# Patient Record
Sex: Female | Born: 1940
Health system: Southern US, Community
[De-identification: ages and names within clinical notes are randomized; demographics above are authoritative.]

## PROBLEM LIST (undated history)

## (undated) DIAGNOSIS — M1990 Unspecified osteoarthritis, unspecified site: Secondary | ICD-10-CM

## (undated) DIAGNOSIS — H409 Unspecified glaucoma: Secondary | ICD-10-CM

## (undated) DIAGNOSIS — E785 Hyperlipidemia, unspecified: Secondary | ICD-10-CM

## (undated) HISTORY — DX: Unspecified osteoarthritis, unspecified site: M19.90

## (undated) HISTORY — DX: Unspecified glaucoma: H40.9

## (undated) HISTORY — DX: Hyperlipidemia, unspecified: E78.5

## (undated) HISTORY — PX: BRAIN SURGERY: SHX531

---

## 1998-10-29 ENCOUNTER — Ambulatory Visit (HOSPITAL_COMMUNITY): Admission: RE | Admit: 1998-10-29 | Discharge: 1998-10-29 | Payer: Self-pay | Admitting: Family Medicine

## 1998-10-29 ENCOUNTER — Encounter: Payer: Self-pay | Admitting: Family Medicine

## 1998-11-25 ENCOUNTER — Encounter: Payer: Self-pay | Admitting: *Deleted

## 1998-11-25 ENCOUNTER — Ambulatory Visit (HOSPITAL_COMMUNITY): Admission: RE | Admit: 1998-11-25 | Discharge: 1998-11-25 | Payer: Self-pay | Admitting: *Deleted

## 1999-11-23 ENCOUNTER — Encounter: Admission: RE | Admit: 1999-11-23 | Discharge: 2000-02-21 | Payer: Self-pay | Admitting: *Deleted

## 2004-03-08 ENCOUNTER — Other Ambulatory Visit: Admission: RE | Admit: 2004-03-08 | Discharge: 2004-03-08 | Payer: Self-pay | Admitting: Family Medicine

## 2004-11-17 ENCOUNTER — Other Ambulatory Visit: Admission: RE | Admit: 2004-11-17 | Discharge: 2004-11-17 | Payer: Self-pay | Admitting: Obstetrics and Gynecology

## 2008-01-28 ENCOUNTER — Encounter: Payer: Self-pay | Admitting: Internal Medicine

## 2008-01-28 LAB — CONVERTED CEMR LAB
ALT: 17 units/L
AST: 16 units/L
Albumin: 4.1 g/dL
CO2: 20 meq/L
Calcium: 8.8 mg/dL
Creatinine, Ser: 0.67 mg/dL
Eosinophils Relative: 2 %
MCV: 91 fL
Monocytes Relative: 7 %
Neutrophils Relative %: 50 %
Platelets: 198 10*3/uL
Potassium: 4.9 meq/L
Sodium: 143 meq/L
Total Protein: 7.6 g/dL

## 2008-08-14 ENCOUNTER — Encounter: Payer: Self-pay | Admitting: Internal Medicine

## 2008-08-14 LAB — CONVERTED CEMR LAB
AST: 18 units/L
Albumin: 3.8 g/dL
CO2: 27 meq/L
Creatinine, Ser: 0.7 mg/dL
Eosinophils Relative: 1.9 %
HCT: 35.6 %
MCV: 91.4 fL
Monocytes Relative: 5.8 %
RBC: 3.9 M/uL
Sodium: 140 meq/L
Total Bilirubin: 0.5 mg/dL
WBC: 5.8 10*3/uL

## 2008-09-03 ENCOUNTER — Encounter: Payer: Self-pay | Admitting: Internal Medicine

## 2008-09-04 ENCOUNTER — Encounter: Payer: Self-pay | Admitting: Internal Medicine

## 2009-08-08 HISTORY — PX: COLONOSCOPY: SHX174

## 2010-02-15 ENCOUNTER — Ambulatory Visit: Payer: Self-pay | Admitting: Internal Medicine

## 2010-02-15 DIAGNOSIS — E669 Obesity, unspecified: Secondary | ICD-10-CM | POA: Insufficient documentation

## 2010-02-15 DIAGNOSIS — S8990XA Unspecified injury of unspecified lower leg, initial encounter: Secondary | ICD-10-CM | POA: Insufficient documentation

## 2010-02-15 DIAGNOSIS — J189 Pneumonia, unspecified organism: Secondary | ICD-10-CM

## 2010-02-15 DIAGNOSIS — S99929A Unspecified injury of unspecified foot, initial encounter: Secondary | ICD-10-CM

## 2010-02-15 DIAGNOSIS — S99919A Unspecified injury of unspecified ankle, initial encounter: Secondary | ICD-10-CM

## 2010-02-15 DIAGNOSIS — K219 Gastro-esophageal reflux disease without esophagitis: Secondary | ICD-10-CM | POA: Insufficient documentation

## 2010-02-15 DIAGNOSIS — M129 Arthropathy, unspecified: Secondary | ICD-10-CM | POA: Insufficient documentation

## 2010-02-15 DIAGNOSIS — Z87448 Personal history of other diseases of urinary system: Secondary | ICD-10-CM | POA: Insufficient documentation

## 2010-02-15 LAB — CONVERTED CEMR LAB
Triglycerides: 88 mg/dL (ref 0.0–149.0)
VLDL: 17.6 mg/dL (ref 0.0–40.0)

## 2010-02-19 ENCOUNTER — Ambulatory Visit (HOSPITAL_COMMUNITY): Admission: RE | Admit: 2010-02-19 | Discharge: 2010-02-19 | Payer: Self-pay | Admitting: Family Medicine

## 2010-02-19 ENCOUNTER — Encounter: Admission: RE | Admit: 2010-02-19 | Discharge: 2010-02-19 | Payer: Self-pay | Admitting: Internal Medicine

## 2010-02-19 ENCOUNTER — Inpatient Hospital Stay (HOSPITAL_COMMUNITY)
Admission: EM | Admit: 2010-02-19 | Discharge: 2010-02-25 | Payer: Self-pay | Source: Home / Self Care | Admitting: Emergency Medicine

## 2010-02-26 ENCOUNTER — Telehealth (INDEPENDENT_AMBULATORY_CARE_PROVIDER_SITE_OTHER): Payer: Self-pay | Admitting: *Deleted

## 2010-03-01 ENCOUNTER — Encounter: Payer: Self-pay | Admitting: Internal Medicine

## 2010-03-24 ENCOUNTER — Encounter (INDEPENDENT_AMBULATORY_CARE_PROVIDER_SITE_OTHER): Payer: Self-pay | Admitting: *Deleted

## 2010-03-29 ENCOUNTER — Ambulatory Visit: Payer: Self-pay | Admitting: Gastroenterology

## 2010-03-31 ENCOUNTER — Ambulatory Visit (HOSPITAL_COMMUNITY): Admission: RE | Admit: 2010-03-31 | Discharge: 2010-03-31 | Payer: Self-pay | Admitting: Neurosurgery

## 2010-04-13 ENCOUNTER — Encounter: Payer: Self-pay | Admitting: Internal Medicine

## 2010-04-19 ENCOUNTER — Ambulatory Visit: Payer: Self-pay | Admitting: Gastroenterology

## 2010-06-02 ENCOUNTER — Ambulatory Visit (HOSPITAL_COMMUNITY): Admission: RE | Admit: 2010-06-02 | Discharge: 2010-06-02 | Payer: Self-pay | Admitting: Neurosurgery

## 2010-08-27 IMAGING — CT CT HEAD W/O CM
1 of 2 series · 16 of 30 positions shown, 20 images · non-contrast
Comparison: [DATE]

CLINICAL DATA: Follow-up subdural

CT HEAD WITHOUT CONTRAST
TECHNIQUE: Contiguous axial images were obtained from the base of
the skull through the vertex without contrast.

[Series 3: recon 2: brain · axial · 0.47mm/px · z∈[+153,+282]mm · 16 of 56 slices shown, 20 images]
[im 3/56  brain]
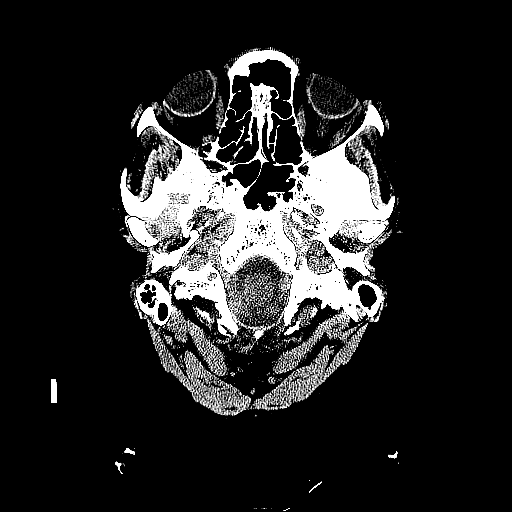
[im 3/56  bone]
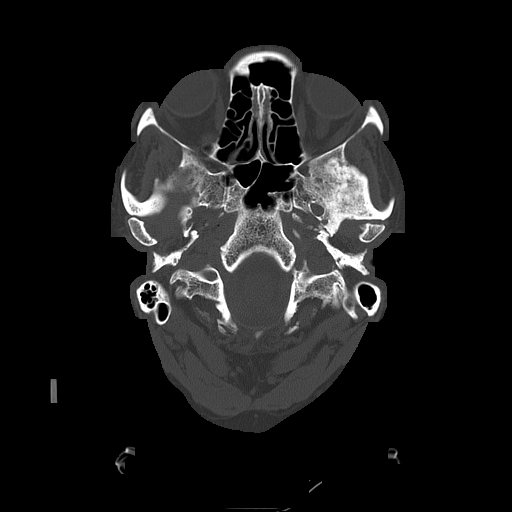
[im 6/56  brain]
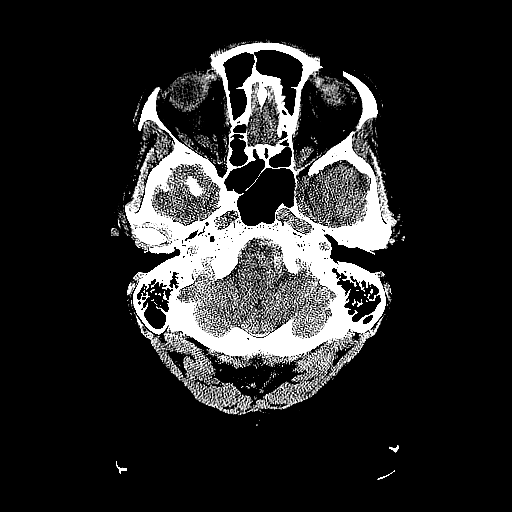
[im 9/56  brain]
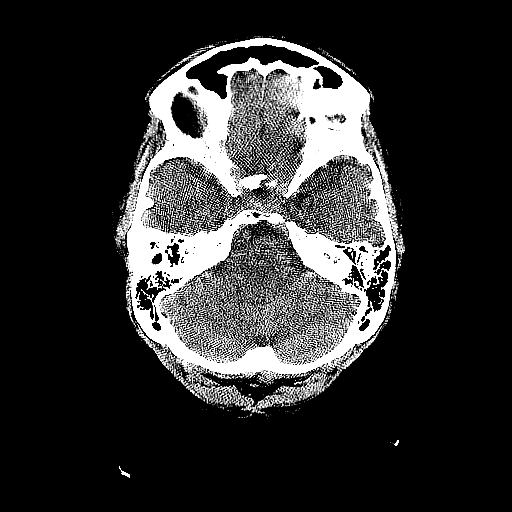
[im 12/56  brain]
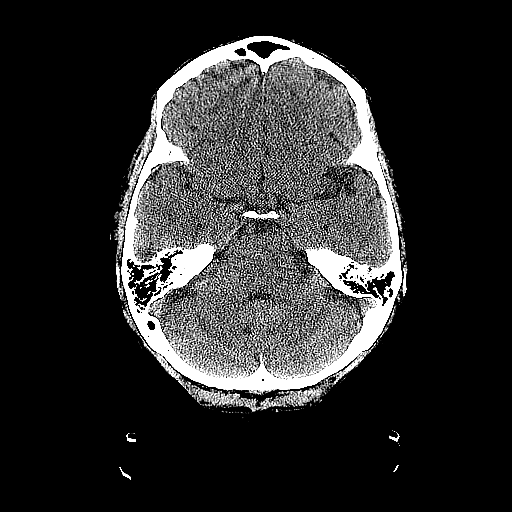
[im 18/56  brain]
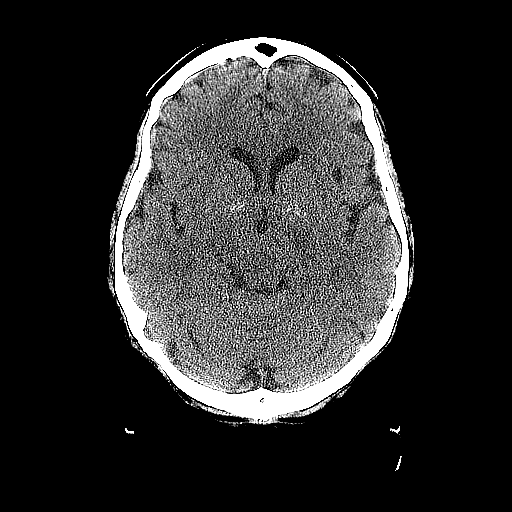
[im 18/56  bone]
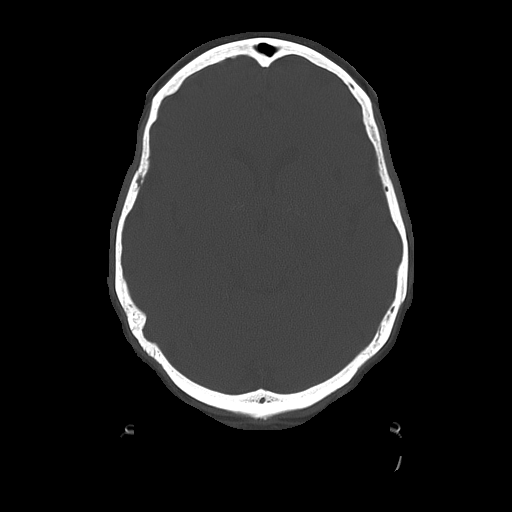
[im 21/56  brain]
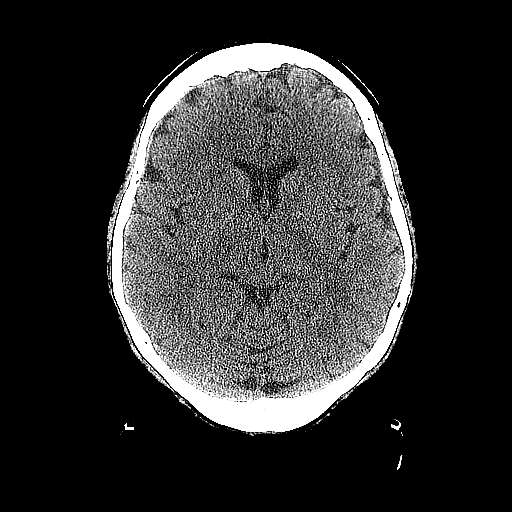
[im 24/56  brain]
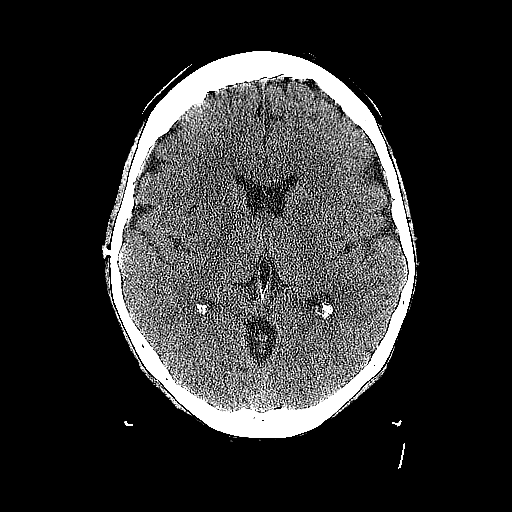
[im 27/56  brain]
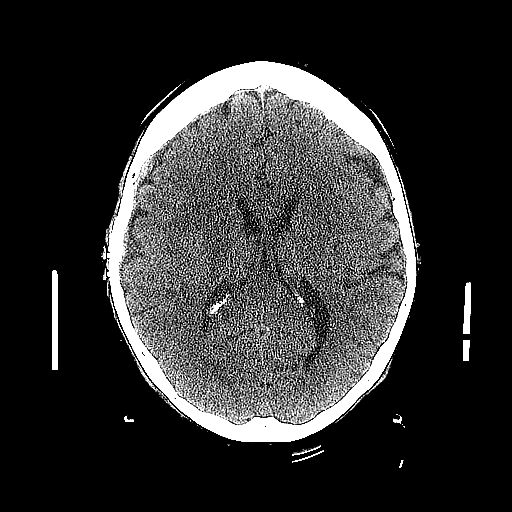
[im 29/56  brain]
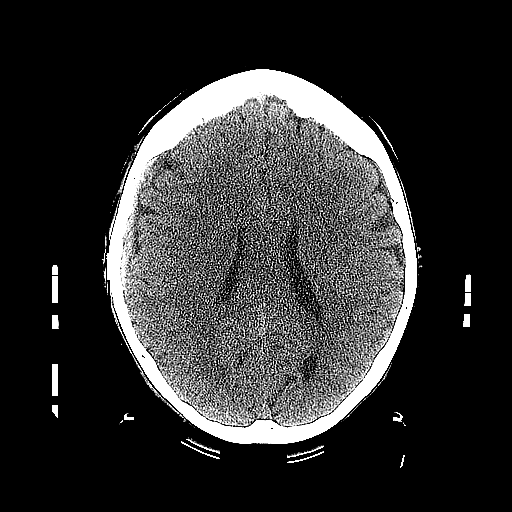
[im 29/56  bone]
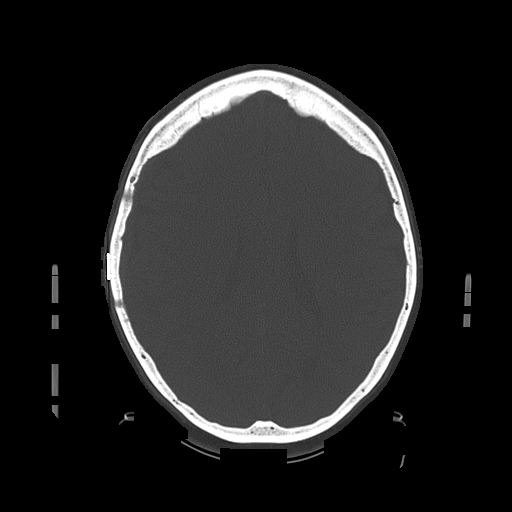
[im 32/56  brain]
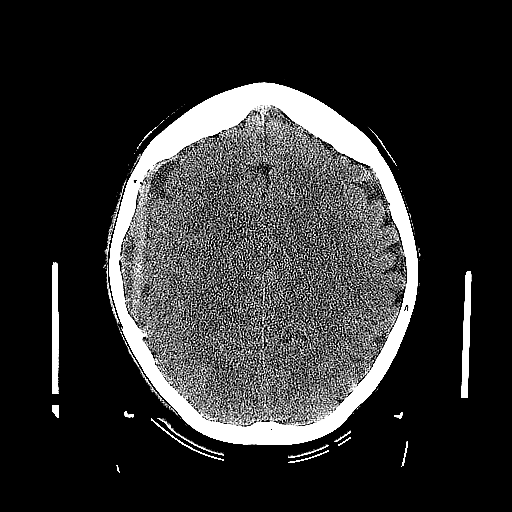
[im 35/56  brain]
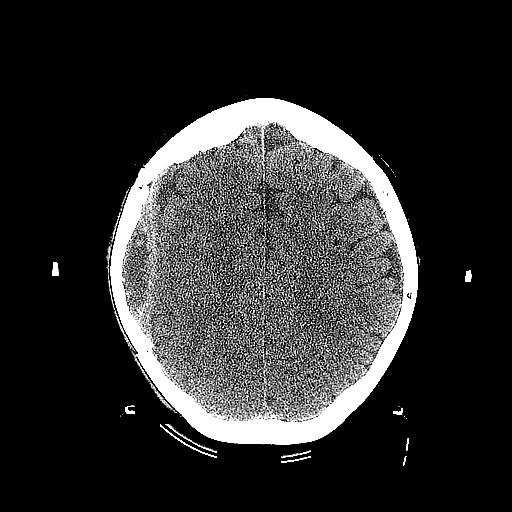
[im 38/56  brain]
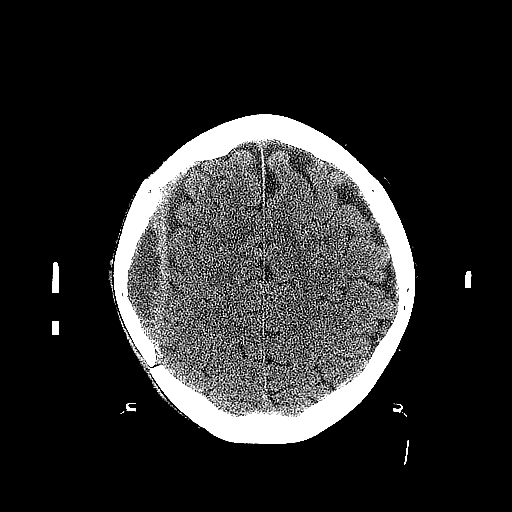
[im 44/56  brain]
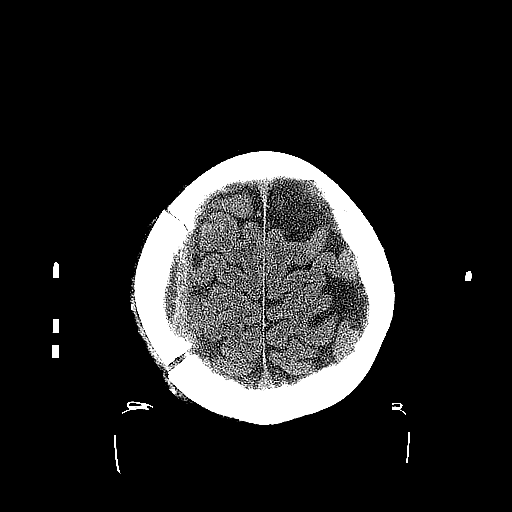
[im 44/56  bone]
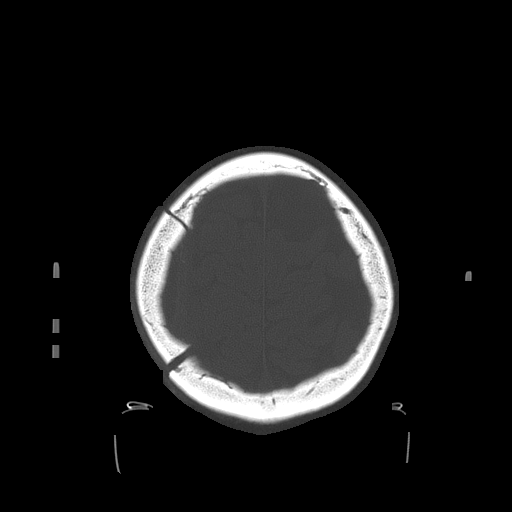
[im 47/56  brain]
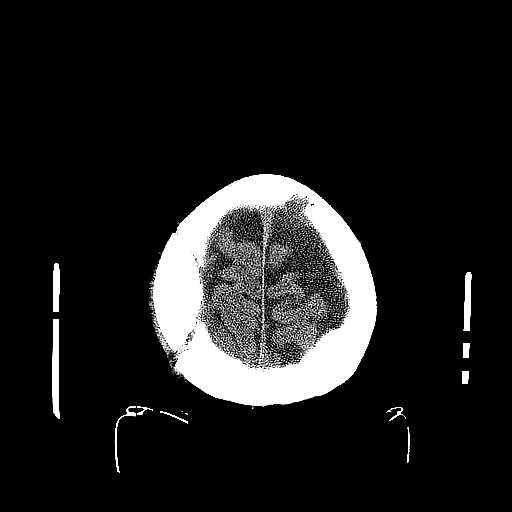
[im 50/56  brain]
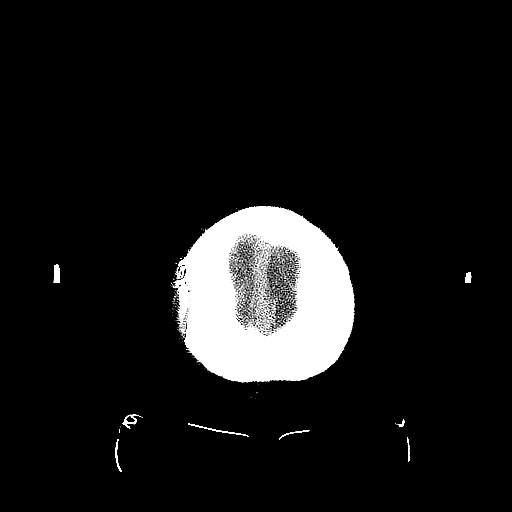
[im 53/56  brain]
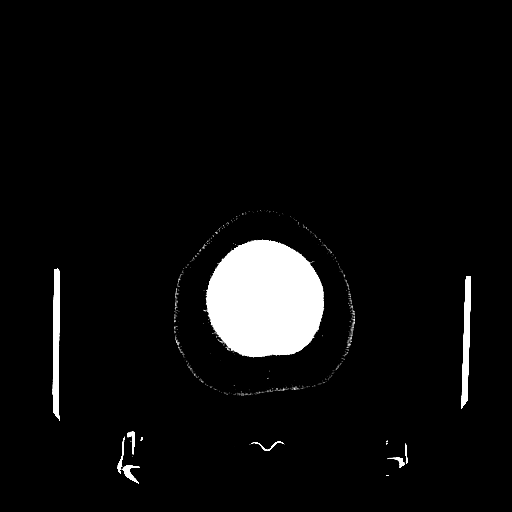

[16 of 30 positions shown; findings below may reference images not displayed]

FINDINGS: The patient has had previous right frontoparietal
craniotomy for evacuation of the large right-sided subdural
hematoma.  There is no residual or recurrent subdural collection.
There is some fluid along the inner aspect of the bone flap
measuring 15 mm in thickness.  There is no midline shift.  No sign
of ischemic infarction, mass lesion or hydrocephalus.
IMPRESSION: Previous right frontoparietal craniotomy for evacuation of a large
right sided subdural hematoma.  No residual or recurrent subdural
material.  There is fluid along the inner margin of the craniotomy
bone, measuring 15 mm in thickness.

## 2010-09-07 NOTE — Letter (Signed)
Summary: [facility]  [facility]   Imported By: Lester Oak 04/16/2010 12:05:22  _____________________________________________________________________  External Attachment:    Type:   Image     Comment:   External Document

## 2010-09-07 NOTE — Progress Notes (Signed)
  Phone Note Other Incoming   Request: Send information Summary of Call: Records received from Dr. Molly Maduro Reade's office. 17 pages forwarded to Dr. Felicity Coyer for review.

## 2010-09-07 NOTE — Miscellaneous (Signed)
Summary: [facility] Previsit/prep  Clinical Lists Changes  Medications: Added new medication of MOVIPREP 100 GM  SOLR (PEG-KCL-NACL-NASULF-NA ASC-C) As per prep instructions. - Signed Rx of MOVIPREP 100 GM  SOLR (PEG-KCL-NACL-NASULF-NA ASC-C) As per prep instructions.;  #1 x 0;  Signed;  Entered by: Wyona Almas RN;  Authorized by: Rachael Fee MD;  Method used: Electronically to [facility] [address], [place], [place], [place]  [address], Ph: [phone], Fax: [phone] Observations: Added new observation of NKA: T (03/29/2010 8:46)    Prescriptions: MOVIPREP 100 GM  SOLR (PEG-KCL-NACL-NASULF-NA ASC-C) As per prep instructions.  #1 x 0   Entered by:   Wyona Almas RN   Authorized by:   Rachael Fee MD   Signed by:   Wyona Almas RN on 03/29/2010   Method used:   Electronically to        [facility] (retail)       [address]       [place], [place]  [address]       Ph: [phone]       Fax: [phone]   RxID:   [id number]

## 2010-09-07 NOTE — Assessment & Plan Note (Signed)
Summary: new / medicare / #/ cd---PER PT  YEARLY APPT---[facility]C   Vital Signs:  Patient profile:   70 year old female Height:      66.5 inches (168.91 cm) Weight:      192.2 pounds (87.36 kg) BMI:     30.67 O2 Sat:      95 % on Room air Temp:     98.9 degrees F (37.17 degrees C) oral Pulse rate:   73 / minute BP sitting:   122 / 72  (left arm) Cuff size:   large  Vitals Entered By: Orlan Leavens (February 15, 2010 9:08 AM)  O2 Flow:  Room air CC: New patient Is Patient Diabetic? No Pain Assessment Patient in pain? no       Vision Screening:Left eye w/o correction: 20 / 20 Both eyes w/o correction:  20/ 25 Right eye with correction: 20 / 25        Vision Entered By: Orlan Leavens (February 15, 2010 9:53 AM)   Primary Care Provider:  Newt Lukes MD  CC:  New patient.  History of Present Illness: new pt to me and our practice, here to est care  also here for wellness exam; patient feels well and has no complaints.  Diet: Heart Healthy  Physical Activities: Sedentary Depression/mood screen: Negative Hearing: grossly normal today Visual Acuity: Grossly normal - see acuity ADL's: Capable  Fall Risk: None Home Safety: Good End-of-Life Planning: Advance directive - Full code/I agree   GERD - takes otc raniditine as needed - incomplete relief - occ symptoms 2x/week persist - no abd pain  feels balance and walking is getting worse - no ha or focal weakness - right ankle "doesn't workHerbalist & Management  Alcohol-Tobacco     Alcohol drinks/day: 0     Alcohol Counseling: not indicated; patient does not drink     Smoking Status: never     Tobacco Counseling: not indicated; no tobacco use  Caffeine-Diet-Exercise     Diet Counseling: to improve diet; diet is suboptimal     Nutrition Referrals: no     Does Patient Exercise: no     Exercise Counseling: to improve exercise regimen     Depression Counseling: not indicated; screening negative for  depression  Safety-Violence-Falls     Seat Belt Use: yes     Seat Belt Counseling: not indicated; patient wears seat belts     Helmet Counseling: not indicated; patient wears helmet when riding bicycle/motocycle     Firearms in the Home: no firearms in the home     Firearm Counseling: not applicable     Smoke Detectors: yes     Smoke Detector Counseling: no     Violence in the Home: no risk noted     Violence Counseling: not indicated; no violence risk noted     Fall Risk Counseling: counseling provided; falls with injury noted  Current Medications (verified): 1)  None  Allergies (verified): No Known Drug Allergies  Past History:  Past Medical History: GERD left ankle instablity, remote injury   Past Surgical History: Tubal ligation  Family History: Emotional illness (parent)  Social History: Never Smoked lives with fiance, never married, no children no alcohol use employed as Location manager Smoking Status:  never Does Patient Exercise:  no Seat Belt Use:  yes  Review of Systems       see HPI above. I have reviewed all other systems and they were negative.   Physical Exam  General:  overweight-appearing.  alert, well-developed, well-nourished, and cooperative to examination.    Head:  Normocephalic and atraumatic without obvious abnormalities. No apparent alopecia or balding. Eyes:  vision grossly intact; pupils equal, round and reactive to light.  conjunctiva and lids normal.    Ears:  normal pinnae bilaterally, without erythema, swelling, or tenderness to palpation. TMs clear, without effusion, or cerumen impaction. Hearing grossly normal bilaterally  Mouth:  teeth and gums in fair repair; mucous membranes moist, without lesions or ulcers. oropharynx clear without exudate, no erythema.  Neck:  supple, full ROM, no masses, no thyromegaly; no thyroid nodules or tenderness. no JVD or carotid bruits.   Lungs:  normal respiratory effort, no intercostal retractions  or use of accessory muscles; normal breath sounds bilaterally - no crackles and no wheezes.    Heart:  normal rate, regular rhythm, no murmur, and no rub. BLE without edema. normal DP pulses and normal cap refill in all 4 extremities    Abdomen:  soft, non-tender, normal bowel sounds, no distention; no masses and no appreciable hepatomegaly or splenomegaly.   Genitalia:  defer to gyn Msk:  left foot drop, ankle wrapped in soft brace - no effusion or gross deformity -  Neurologic:  alert & oriented X3 and cranial nerves II-XII symetrically intact.  strength normal in all extremities (except for left foot drop), sensation intact to light touch, and gait favoring left side. speech fluent without dysarthria or aphasia; follows commands with good comprehension.  Skin:  no rashes, vesicles, ulcers, or erythema. No nodules or irregularity to palpation.  Psych:  Oriented X3, memory intact for recent and remote, normally interactive, good eye contact, not anxious appearing, not depressed appearing, and not agitated.      Impression & Recommendations:  Problem # 1:  PREVENTIVE HEALTH CARE (ICD-V70.0) Patient is here for annual wellness evaluation. Patient has been counseled on age-appropriate routine health concerns for screening and prevention. These are reviewed and up-to-date. Immunizations are up-to-date or declined. Labs ordered. Risk factors for depression per USPSTF are reviewed and negative. Patient visual acuity and hearing function is screened today; ADLs are reviewed and addressed as needed; functional ability and level of safety have been reviewed and are appropriate. Education, counseling, and referrals are performed based on age appropriate health issues. Patient has information regarding separate preventitive health services covered by medicare  Orders: Gastroenterology Referral (GI) Gynecologic Referral (Gyn) Misc. Referral (Misc. Ref) TLB-Lipid Panel ([id number]) TLB-Glucose, QUANT  (82947-GLU) First annual wellness visit with prevention plan  (O1308)  Problem # 2:  ANKLE INJURY, RIGHT (ICD-959.7) eval by ortho given difficulty with ambulation - ?different orthotic if no surgical repair Orders: Orthopedic Surgeon Referral (Ortho Surgeon)  Problem # 3:  GERD (ICD-530.81) new rx rec for symptoms  Her updated medication list for this problem includes:    Omeprazole 20 Mg Cpdr (Omeprazole) .Marland Kitchen... 1 by mouth once daily x 7 days, then as needed for acid symptoms  Complete Medication List: 1)  Omeprazole 20 Mg Cpdr (Omeprazole) .Marland Kitchen.. 1 by mouth once daily x 7 days, then as needed for acid symptoms 2)  Aspirin Ec Low Strength 81 Mg Tbec (Aspirin) .Marland Kitchen.. 1 by mouth once daily 3)  Tylenol Extra Strength 500 Mg Tabs (Acetaminophen) .Marland Kitchen.. 1-2 by mouth three times a day as needed for arthiritis pain  Patient Instructions: 1)  it was good to see you today. 2)  medical physical done today - papers given to you on the included benefits - 3)  we'll make referral for mammogram, PAP/pelvic, colonoscopy. Our office will contact you regarding this appointment once made.  4)  also we'll make referral to orthopedics for your right ankle. Our office will contact you regarding this appointment once made.  5)  test(s) ordered today - your results will be posted on the phone tree for review in 48-72 hours from the time of test completion; call [phone] and enter your 9 digit MRN (listed above on this page, just below your name); if any changes need to be made or there are abnormal results, you will be contacted directly.  6)  try omperazole for your hernia and reflux symptoms - let us know if you need a prescription for this medication 7)  Please schedule a follow-up appointment in 3-6 months to review, sooner if problems.

## 2010-09-07 NOTE — Letter (Signed)
Summary: [facility] Instructions  [facility]  [address] [place], [place] [address]   Phone: [phone]  Fax: [phone]       Tara Jimenez    10-02-1940    MRN: [id number]        Procedure Day Dorna Bloom:  Duanne Limerick  69/12/11     Arrival Time:  69:00AM      Procedure Time:  10:00AM     Location of Procedure:                    _ X_  [facility] (4th Floor)                       PREPARATION FOR COLONOSCOPY WITH MOVIPREP   Starting 69 days prior to your procedure 04/14/10 do not eat nuts, seeds, popcorn, corn, beans, peas,  salads, or any raw vegetables.  Do not take any fiber supplements (e.g. Metamucil, Citrucel, and Benefiber).  THE DAY BEFORE YOUR PROCEDURE         DATE: 69/11/11  DAY: SUNDAY  1.  Drink clear liquids the entire day-NO SOLID FOOD  2.  Do not drink anything colored red or purple.  Avoid juices with pulp.  No orange juice.  3.  Drink at least 64 oz. (8 glasses) of fluid/clear liquids during the day to prevent dehydration and help the prep work efficiently.  CLEAR LIQUIDS INCLUDE: Water Jello Ice Popsicles Tea (sugar ok, no milk/cream) Powdered fruit flavored drinks Coffee (sugar ok, no milk/cream) Gatorade Juice: apple, white grape, white cranberry  Lemonade Clear bullion, consomm, broth Carbonated beverages (any kind) Strained chicken noodle soup Hard Candy                             4.  In the morning, mix first dose of MoviPrep solution:    Empty 1 Pouch A and 1 Pouch B into the disposable container    Add lukewarm drinking water to the top line of the container. Mix to dissolve    Refrigerate (mixed solution should be used within 24 hrs)  5.  Begin drinking the prep at 5:00 p.m. The MoviPrep container is divided by 4 marks.   Every 15 minutes drink the solution down to the next mark (approximately 8 oz) until the full liter is complete.   6.  Follow completed prep with 16 oz of clear liquid of your choice (Nothing  red or purple).  Continue to drink clear liquids until bedtime.  7.  Before going to bed, mix second dose of MoviPrep solution:    Empty 1 Pouch A and 1 Pouch B into the disposable container    Add lukewarm drinking water to the top line of the container. Mix to dissolve    Refrigerate  THE DAY OF YOUR PROCEDURE      DATE: 04/19/10  DAY:  MONDAY  Beginning at 5:00AM (5 hours before procedure):         1. Every 15 minutes, drink the solution down to the next mark (approx 8 oz) until the full liter is complete.  2. Follow completed prep with 16 oz. of clear liquid of your choice.    3. You may drink clear liquids until 8:00AM (2 HOURS BEFORE PROCEDURE).   MEDICATION INSTRUCTIONS  Unless otherwise instructed, you should take regular prescription medications with a small sip of water   as early as possible the  morning of your procedure.        OTHER INSTRUCTIONS  You will need a responsible adult at least 69 years of age to accompany you and drive you home.   This person must remain in the waiting room during your procedure.  Wear loose fitting clothing that is easily removed.  Leave jewelry and other valuables at home.  However, you may wish to bring a book to read or  an iPod/MP3 player to listen to music as you wait for your procedure to start.  Remove all body piercing jewelry and leave at home.  Total time from sign-in until discharge is approximately 2-3 hours.  You should go home directly after your procedure and rest.  You can resume normal activities the  day after your procedure.  The day of your procedure you should not:   Drive   Make legal decisions   Operate machinery   Drink alcohol   Return to work  You will receive specific instructions about eating, activities and medications before you leave.    The above instructions have been reviewed and explained to me by   Tara Almas RN  March 29, 2010 9:21 AM     I fully understand and can  verbalize these instructions _____________________________ Date _________

## 2010-09-07 NOTE — Progress Notes (Signed)
Summary: Physical Exam Forms / Patient Medicare  Physical Exam Forms / Patient Medicare   Imported By: Lennie Odor 02/18/2010 09:35:54  _____________________________________________________________________  External Attachment:    Type:   Image     Comment:   External Document

## 2010-09-07 NOTE — Progress Notes (Signed)
Summary: Medicare form  Medicare form   Imported By: Lester [place] 02/17/2010 07:57:46  _____________________________________________________________________  External Attachment:    Type:   Image     Comment:   External Document

## 2010-09-07 NOTE — Procedures (Signed)
Summary: Colonoscopy  Patient: Tara Jimenez Note: All result statuses are Final unless otherwise noted.  Tests: (1) Colonoscopy (COL)   COL Colonoscopy           DONE     [facility]     [address]     [place], [place]  [address]           COLONOSCOPY PROCEDURE REPORT           PATIENT:  Tara Jimenez  MR#:  [id number]     BIRTHDATE:  09/16/1940, 68 yrs. old  GENDER:  female     ENDOSCOPIST:  Rachael Fee, MD     REF. BY:  Rene Paci, M.D.     PROCEDURE DATE:  04/19/2010     PROCEDURE:  Diagnostic Colonoscopy     ASA CLASS:  Class II     INDICATIONS:  Elevated Risk Screening, sister with colon cancer     MEDICATIONS:   Fentanyl 75 mcg IV, Versed 5 mg IV           DESCRIPTION OF PROCEDURE:   After the risks benefits and     alternatives of the procedure were thoroughly explained, informed     consent was obtained.  Digital rectal exam was performed and     revealed no rectal masses.   The LB [id number] endoscope     was introduced through the anus and advanced to the cecum, which     was identified by both the appendix and ileocecal valve, without     limitations.  The quality of the prep was good, using MoviPrep.     The instrument was then slowly withdrawn as the colon was fully     examined.     <<PROCEDUREIMAGES>>     FINDINGS:  Mild diverticulosis was found in the sigmoid to     descending colon segments (see image3).  This was otherwise a     normal examination of the colon (see image1, image2, and image4).     Retroflexed views in the rectum revealed no abnormalities.    The     scope was then withdrawn from the patient and the procedure     completed.     COMPLICATIONS:  None           ENDOSCOPIC IMPRESSION:     1) Mild diverticulosis in the sigmoid to descending colon     segments     2) Otherwise normal examination; no polyps or cancers           RECOMMENDATIONS:     1) Given your significant family history of colon cancer  (sister), you should have a repeat colonoscopy in 5 years           REPEAT EXAM:  5 years           ______________________________     Rachael Fee, MD           n.     eSIGNED:   Rachael Fee at 04/19/2010 10:40 AM           Lavella Hammock, [id number]  Note: An exclamation mark (!) indicates a result that was not dispersed into the flowsheet. Document Creation Date: 04/19/2010 10:41 AM _______________________________________________________________________  (1) Order result status: Final Collection or observation date-time: 04/19/2010 10:35 Requested date-time:  Receipt date-time:  Reported date-time:  Referring Physician:   Ordering Physician: Rob Bunting [phone]) Specimen Source:  Source: Launa Grill Order Number:  [id number] Lab site:   Appended Document: Colonoscopy    Clinical Lists Changes  Observations: Added new observation of COLONNXTDUE: 04/2015 (04/19/2010 11:17)

## 2010-09-07 NOTE — Letter (Signed)
Summary: [facility]  [facility]   Imported By: Lester [place] 03/05/2010 08:13:49  _____________________________________________________________________  External Attachment:    Type:   Image     Comment:   External Document

## 2010-10-23 LAB — BASIC METABOLIC PANEL
BUN: 9 mg/dL (ref 6–23)
CO2: 24 mEq/L (ref 19–32)
Creatinine, Ser: 0.7 mg/dL (ref 0.4–1.2)
GFR calc Af Amer: >60 mL/min (ref >60)

## 2010-10-23 LAB — URINE CULTURE

## 2010-10-23 LAB — PROTIME-INR: Prothrombin Time: 12.9 seconds (ref 11.6–15.2)

## 2010-10-23 LAB — URINE MICROSCOPIC-ADD ON

## 2010-10-23 LAB — CBC
HCT: 38 % (ref 36.0–46.0)
Hemoglobin: 12.4 g/dL (ref 12.0–15.0)
MCH: 30 pg (ref 26.0–34.0)
Platelets: 160 10*3/uL (ref 150–400)
RDW: 13.3 % (ref 11.5–15.5)

## 2010-10-23 LAB — BUN: BUN: 10 mg/dL (ref 6–23)

## 2010-10-23 LAB — DIFFERENTIAL
Basophils Absolute: 0 10*3/uL (ref 0.0–0.1)
Eosinophils Absolute: 0.1 10*3/uL (ref 0.0–0.7)
Lymphocytes Relative: 26 % (ref 12–46)
Lymphs Abs: 1.5 10*3/uL (ref 0.7–4.0)
Monocytes Relative: 4 % (ref 3–12)
Neutro Abs: 3.9 10*3/uL (ref 1.7–7.7)

## 2010-10-23 LAB — APTT: aPTT: 28 seconds (ref 24–37)

## 2010-10-23 LAB — URINALYSIS, ROUTINE W REFLEX MICROSCOPIC
Bilirubin Urine: NEGATIVE
Nitrite: NEGATIVE
Protein, ur: NEGATIVE mg/dL
Specific Gravity, Urine: 1.013 (ref 1.005–1.030)

## 2010-10-23 LAB — CREATININE, SERUM
Creatinine, Ser: 0.78 mg/dL (ref 0.4–1.2)
GFR calc non Af Amer: >60 mL/min (ref >60)

## 2010-10-23 LAB — POCT CARDIAC MARKERS: Myoglobin, poc: 70.8 ng/mL (ref 12–200)

## 2010-10-23 LAB — MRSA PCR SCREENING: MRSA by PCR: NEGATIVE

## 2011-08-25 DIAGNOSIS — H8309 Labyrinthitis, unspecified ear: Secondary | ICD-10-CM | POA: Diagnosis not present

## 2011-08-25 DIAGNOSIS — R350 Frequency of micturition: Secondary | ICD-10-CM | POA: Diagnosis not present

## 2011-10-05 DIAGNOSIS — H1045 Other chronic allergic conjunctivitis: Secondary | ICD-10-CM | POA: Diagnosis not present

## 2012-01-04 DIAGNOSIS — H4011X0 Primary open-angle glaucoma, stage unspecified: Secondary | ICD-10-CM | POA: Diagnosis not present

## 2012-01-04 DIAGNOSIS — H409 Unspecified glaucoma: Secondary | ICD-10-CM | POA: Diagnosis not present

## 2012-01-05 ENCOUNTER — Ambulatory Visit (INDEPENDENT_AMBULATORY_CARE_PROVIDER_SITE_OTHER): Payer: Medicare Other | Admitting: Family Medicine

## 2012-01-05 ENCOUNTER — Ambulatory Visit: Payer: Medicare Other

## 2012-01-05 VITALS — BP 122/73 | HR 67 | Temp 98.5°F | Resp 18 | Ht 67.0 in | Wt 197.0 lb

## 2012-01-05 DIAGNOSIS — I6200 Nontraumatic subdural hemorrhage, unspecified: Secondary | ICD-10-CM

## 2012-01-05 DIAGNOSIS — R195 Other fecal abnormalities: Secondary | ICD-10-CM | POA: Diagnosis not present

## 2012-01-05 DIAGNOSIS — S065XAA Traumatic subdural hemorrhage with loss of consciousness status unknown, initial encounter: Secondary | ICD-10-CM

## 2012-01-05 DIAGNOSIS — S065X9A Traumatic subdural hemorrhage with loss of consciousness of unspecified duration, initial encounter: Secondary | ICD-10-CM

## 2012-01-05 DIAGNOSIS — D649 Anemia, unspecified: Secondary | ICD-10-CM

## 2012-01-05 LAB — IFOBT (OCCULT BLOOD): IFOBT: NEGATIVE

## 2012-01-05 LAB — POCT CBC
Granulocyte percent: 40.9 %G (ref 37–80)
Hemoglobin: 11.9 g/dL — AB (ref 12.2–16.2)
Lymph, poc: 1.7 (ref 0.6–3.4)
MCHC: 31.3 g/dL — AB (ref 31.8–35.4)
POC Granulocyte: 1.3 — AB (ref 2–6.9)
POC MID %: 8.1 %M (ref 0–12)
RDW, POC: 13.3 %

## 2012-01-05 NOTE — Progress Notes (Signed)
Patient Name: Tara Jimenez Date of Birth: Apr 09, 1941 Medical Record Number: [id number] Gender: female Date of Encounter: 01/05/2012  History of Present Illness:  Tara Jimenez is a 71 y.o. very pleasant female patient who presents with the following:  I saw her last in July 2011 when she was found to have a large subdural hematoma- she has done very well and has no neurologic deficits.   She is here today due to "trouble with bowel movements" for the last 4 or 5 weeks.  She has felt very constipated, and her stool is very dark- not black but dark.  She feels uncomfortable in her abdomen.   She is only having a stool every couple of days and has to strain to pass stool.  She has tried some mag citrate once- this did help and seemed to make her stool lighter in color as well.  No nausea or vomiting.   She had a colonoscopy a couple of years ago- it looked ok.  This was done by [facility] in September 2011- note available on Epic and looked fine except for mild diverticulosis.    Patient Active Problem List  Diagnoses  . OBESITY  . PNEUMONIA  . GERD  . ARTHRITIS  . ANKLE INJURY, RIGHT  . UTI'S, HX OF  . Subdural hematoma   No past medical history on file. No past surgical history on file. History  Substance Use Topics  . Smoking status: Never Smoker   . Smokeless tobacco: Not on file  . Alcohol Use: No   No family history on file. No Known Allergies  Medication list has been reviewed and updated.  Prior to Admission medications   Medication Sig Start Date End Date Taking? Authorizing Provider  aspirin 81 MG tablet Take 81 mg by mouth daily.   Yes Historical Provider, MD  tetrahydrozoline 0.05 % ophthalmic solution 1 drop.    Historical Provider, MD    Review of Systems: As per HPI- otherwise negative. She has not noted a change in stool caliper, but sometimes they are small "like rabbit pellets"  Physical Examination: Filed Vitals:   01/05/12 1102  BP: 122/73    Pulse: 67  Temp: 98.5 F (36.9 C)  Resp: 18   Filed Vitals:   01/05/12 1102  Height: 5\' 7"  (1.702 m)  Weight: 197 lb (89.359 kg)   Body mass index is 30.85 kg/(m^2). GEN: WDWN, NAD, Non-toxic, A & O x 3, obese HEENT: Atraumatic, Normocephalic. Neck supple. No masses, No LAD. Ears and Nose: No external deformity. CV: RRR, No M/G/R. No JVD. No thrill. No extra heart sounds. PULM: CTA B, no wheezes, crackles, rhonchi. No retractions. No resp. distress. No accessory muscle use. ABD: S, NT, +BS. No rebound. No HSM. A little full in abdomen consistent with increased stool volume EXTR: No c/c/e NEURO Normal gait.  PSYCH: Normally interactive. Conversant. Not depressed or anxious appearing.  Calm demeanor.  Rectal: small external hemorrhoid, normal exam otherwise, no gross blood  [facility] reading (PRIMARY) by  Dr. Patsy Lager.  Abdominal series:  Heavy colonic stool burden, no obstructive pattern seen Results for orders placed in visit on 01/05/12  POCT CBC      Component Value Range   WBC 3.3 (*) 4.6 - 10.2 (K/uL)   Lymph, poc 1.7  0.6 - 3.4    POC LYMPH PERCENT 51.0 (*) 10 - 50 (%L)   MID (cbc) 0.3  0 - 0.9    POC MID % 8.1  0 - 12 (%  M)   POC Granulocyte 1.3 (*) 2 - 6.9    Granulocyte percent 40.9  37 - 80 (%G)   RBC 4.19  4.04 - 5.48 (M/uL)   Hemoglobin 11.9 (*) 12.2 - 16.2 (g/dL)   HCT, POC 16.1  09.6 - 47.9 (%)   MCV 90.6  80 - 97 (fL)   MCH, POC 28.4  27 - 31.2 (pg)   MCHC 31.3 (*) 31.8 - 35.4 (g/dL)   RDW, POC 04.5     Platelet Count, POC 192  142 - 424 (K/uL)   MPV 11.2  0 - 99.8 (fL)  IFOBT (OCCULT BLOOD)      Component Value Range   IFOBT Negative       Assessment and Plan: 1. Subdural hematoma    2. Constipation  DG Abd 2 Views  3. Dark stools  POCT CBC, IFOBT POC (occult bld, rslt in office)  4. Anemia     See patient instructions plan to treat constipation with miralax and fiber.  If not better in a week or so please call or RTC. She states that she has a history  of anemia.  However, her wbc count is a little low as well.  She will follow- up with [place] or her PCP for a recheck CBC in about a month.  If her constipation symptoms do not resolve will plan to have her follow- up with GI  Kendrell Lottman, MD 01/05/2012 11:22 AM

## 2012-01-05 NOTE — Patient Instructions (Signed)
Buy some miralax- take one of two capfulls daily until effect.  You can also try a glycerin suppository.  Increase the fiber in your diet (you might try fiber one cereal) and drink plenty of water!  If you are not feeling better in the next 3 or 4 days please call

## 2012-01-18 DIAGNOSIS — H4011X0 Primary open-angle glaucoma, stage unspecified: Secondary | ICD-10-CM | POA: Diagnosis not present

## 2012-01-18 DIAGNOSIS — H409 Unspecified glaucoma: Secondary | ICD-10-CM | POA: Diagnosis not present

## 2012-03-17 ENCOUNTER — Ambulatory Visit (INDEPENDENT_AMBULATORY_CARE_PROVIDER_SITE_OTHER): Payer: Medicare Other | Admitting: Family Medicine

## 2012-03-17 VITALS — BP 139/74 | HR 75 | Temp 98.6°F | Resp 18 | Ht 67.0 in | Wt 196.0 lb

## 2012-03-17 DIAGNOSIS — S91109A Unspecified open wound of unspecified toe(s) without damage to nail, initial encounter: Secondary | ICD-10-CM | POA: Diagnosis not present

## 2012-03-17 DIAGNOSIS — S91209A Unspecified open wound of unspecified toe(s) with damage to nail, initial encounter: Secondary | ICD-10-CM

## 2012-03-17 DIAGNOSIS — B351 Tinea unguium: Secondary | ICD-10-CM | POA: Diagnosis not present

## 2012-03-17 NOTE — Progress Notes (Signed)
Patient ID: Tara Jimenez MRN: [id number], DOB: 06-27-1941, 71 y.o. Date of Encounter: 03/17/2012, 3:42 PM   PROCEDURE NOTE: Verbal consent obtained. Sterile technique employed. Numbing: Digital block with 2% lidocaine plain.  Betadine prep.  Great toenail removed without difficulty. Xeroform applied.  Wound cleansed and dressed.  Wound care instructions including precautions covered with patient.    Rhoderick Moody, PA-C 03/17/2012 3:42 PM

## 2012-03-17 NOTE — Progress Notes (Signed)
  Subjective:    Patient ID: Tayjah Lobdell, female    DOB: 03-08-1941, 71 y.o.   MRN: [id number]  HPI Keelia Graybill is a 71 y.o. female R great to struck by closet door this am. Hx of discoloration and thickened toe, but it bent back with door.   Able to weight bear, no bleeding.  Just sore at end of nail only.  Not painful in bones of toe.    Review of Systems As above.     Objective:   Physical Exam  Constitutional: She is oriented to person, place, and time. She appears well-developed and well-nourished.  Musculoskeletal:       Right foot: She exhibits normal range of motion, no bony tenderness, no swelling and normal capillary refill.       Feet:  Neurological: She is alert and oriented to person, place, and time.       nvi distally.   Skin: Skin is warm and dry.       Skin of toe intact, see above for nail.   Psychiatric: She has a normal mood and affect. Her behavior is normal.       Assessment & Plan:  Keyanah Kozicki is a 71 y.o. female 1. Toenail avulsion   2. Onychomycosis of toenail     Injury to great toe with near avulsion, and underlying onychomycotic nail. Removed per procedure note.  Advised of possible deformity of new nail, and possible onychomycosis of that nail as well.  rtc precautions given.

## 2012-05-17 DIAGNOSIS — H4011X0 Primary open-angle glaucoma, stage unspecified: Secondary | ICD-10-CM | POA: Diagnosis not present

## 2012-05-17 DIAGNOSIS — H409 Unspecified glaucoma: Secondary | ICD-10-CM | POA: Diagnosis not present

## 2012-12-11 DIAGNOSIS — H4011X0 Primary open-angle glaucoma, stage unspecified: Secondary | ICD-10-CM | POA: Diagnosis not present

## 2013-06-19 DIAGNOSIS — H409 Unspecified glaucoma: Secondary | ICD-10-CM | POA: Diagnosis not present

## 2013-06-19 DIAGNOSIS — H4011X0 Primary open-angle glaucoma, stage unspecified: Secondary | ICD-10-CM | POA: Diagnosis not present

## 2013-09-04 DIAGNOSIS — L919 Hypertrophic disorder of the skin, unspecified: Secondary | ICD-10-CM | POA: Diagnosis not present

## 2013-09-04 DIAGNOSIS — D485 Neoplasm of uncertain behavior of skin: Secondary | ICD-10-CM | POA: Diagnosis not present

## 2013-09-04 DIAGNOSIS — D1739 Benign lipomatous neoplasm of skin and subcutaneous tissue of other sites: Secondary | ICD-10-CM | POA: Diagnosis not present

## 2013-09-04 DIAGNOSIS — D239 Other benign neoplasm of skin, unspecified: Secondary | ICD-10-CM | POA: Diagnosis not present

## 2013-09-04 DIAGNOSIS — L909 Atrophic disorder of skin, unspecified: Secondary | ICD-10-CM | POA: Diagnosis not present

## 2013-09-04 DIAGNOSIS — L821 Other seborrheic keratosis: Secondary | ICD-10-CM | POA: Diagnosis not present

## 2013-09-04 DIAGNOSIS — D1801 Hemangioma of skin and subcutaneous tissue: Secondary | ICD-10-CM | POA: Diagnosis not present

## 2013-12-16 DIAGNOSIS — H4011X0 Primary open-angle glaucoma, stage unspecified: Secondary | ICD-10-CM | POA: Diagnosis not present

## 2013-12-16 DIAGNOSIS — H409 Unspecified glaucoma: Secondary | ICD-10-CM | POA: Diagnosis not present

## 2014-06-17 DIAGNOSIS — H4011X1 Primary open-angle glaucoma, mild stage: Secondary | ICD-10-CM | POA: Diagnosis not present

## 2014-09-05 ENCOUNTER — Other Ambulatory Visit: Payer: Self-pay

## 2014-09-05 DIAGNOSIS — Z1231 Encounter for screening mammogram for malignant neoplasm of breast: Secondary | ICD-10-CM

## 2014-09-10 ENCOUNTER — Ambulatory Visit (INDEPENDENT_AMBULATORY_CARE_PROVIDER_SITE_OTHER): Payer: Medicare Other

## 2014-09-10 ENCOUNTER — Ambulatory Visit (INDEPENDENT_AMBULATORY_CARE_PROVIDER_SITE_OTHER): Payer: Medicare Other | Admitting: Family Medicine

## 2014-09-10 VITALS — BP 182/98 | HR 65 | Temp 98.0°F | Resp 18 | Ht 66.0 in | Wt 190.0 lb

## 2014-09-10 DIAGNOSIS — N644 Mastodynia: Secondary | ICD-10-CM | POA: Diagnosis not present

## 2014-09-10 DIAGNOSIS — M25562 Pain in left knee: Secondary | ICD-10-CM

## 2014-09-10 DIAGNOSIS — M25519 Pain in unspecified shoulder: Secondary | ICD-10-CM

## 2014-09-10 DIAGNOSIS — M353 Polymyalgia rheumatica: Secondary | ICD-10-CM | POA: Diagnosis not present

## 2014-09-10 LAB — COMPREHENSIVE METABOLIC PANEL
ALT: 12 U/L (ref 0–35)
AST: 15 U/L (ref 0–37)
Albumin: 4.2 g/dL (ref 3.5–5.2)
Alkaline Phosphatase: 56 U/L (ref 39–117)
BUN: 12 mg/dL (ref 6–23)
CO2: 27 mEq/L (ref 19–32)
Calcium: 9.2 mg/dL (ref 8.4–10.5)
Chloride: 105 mEq/L (ref 96–112)
Creat: 0.77 mg/dL (ref 0.50–1.10)
Glucose, Bld: 90 mg/dL (ref 70–99)
Potassium: 4.5 mEq/L (ref 3.5–5.3)
Sodium: 139 mEq/L (ref 135–145)
Total Bilirubin: 0.4 mg/dL (ref 0.2–1.2)
Total Protein: 7.5 g/dL (ref 6.0–8.3)

## 2014-09-10 LAB — POCT CBC
Granulocyte percent: 50.1 %G (ref 37–80)
HCT, POC: 97.7 % — AB (ref 37.7–47.9)
Hemoglobin: 11.9 g/dL — AB (ref 12.2–16.2)
Lymph, poc: 1.7 (ref 0.6–3.4)
MCH, POC: 28.7 pg (ref 27–31.2)
MCHC: 31.5 g/dL — AB (ref 31.8–35.4)
MCV: 91.2 fL (ref 80–97)
MID (cbc): 0.2 (ref 0–0.9)
MPV: 9.7 fL (ref 0–99.8)
POC Granulocyte: 1.9 — AB (ref 2–6.9)
POC LYMPH PERCENT: 44.8 %L (ref 10–50)
POC MID %: 5.1 %M (ref 0–12)
Platelet Count, POC: 156 10*3/uL (ref 142–424)
RBC: 4.13 M/uL (ref 4.04–5.48)
RDW, POC: 14.3 %
WBC: 3.8 10*3/uL — AB (ref 4.6–10.2)

## 2014-09-10 LAB — POCT SEDIMENTATION RATE: POCT SED RATE: 53 mm/hr — AB (ref 0–22)

## 2014-09-10 MED ORDER — PREDNISONE 20 MG PO TABS: 20.0000 mg | ORAL_TABLET | Freq: Every day | ORAL | Status: DC

## 2014-09-10 MED ORDER — MELOXICAM 7.5 MG PO TABS: 7.5000 mg | ORAL_TABLET | Freq: Every day | ORAL | Status: DC

## 2014-09-10 NOTE — Addendum Note (Signed)
Addended by: Robyn Haber on: 09/10/2014 04:43 PM   Modules accepted: Orders

## 2014-09-10 NOTE — Progress Notes (Addendum)
Subjective:  This chart was scribed for Robyn Haber, MD, by Tamsen Roers, at [facility].  This patient was seen in room 1 and the patient's care was started at 12:02 PM.    Patient ID: Tara Jimenez, female    DOB: 29-Oct-1940, 74 y.o.   MRN: [id number]  HPI  HPI Comments: Tara Jimenez is a 74 y.o. female who presents to [facility] complaining of intermittent bilateral breast/ shoulder/ arm soreness / tingling onset a week ago ( notes that her symptoms are mostly on her left side).  Patient notes that she has never had these symptoms before. She also notes that her left knee has pain when she ambulates in the middle of the night. Patient will have her mammogram on Friday, she denies feeling any lumps. Patient is retired. Patients PCP is Dr. Edilia Bo who she is trying to make an appointment with.   Denies cough, SOB, abdominal pain, neck pains.  BP on right arm: 152/72      Patient Active Problem List   Diagnosis Date Noted   Subdural hematoma 02/19/2010   OBESITY 02/15/2010   PNEUMONIA 02/15/2010   GERD 02/15/2010   ARTHRITIS 02/15/2010   ANKLE INJURY, RIGHT 02/15/2010   UTI'S, HX OF 02/15/2010   History reviewed. No pertinent past medical history. History reviewed. No pertinent past surgical history. No Known Allergies Prior to Admission medications   Medication Sig Start Date End Date Taking? Authorizing Provider  tetrahydrozoline 0.05 % ophthalmic solution 1 drop.   Yes Historical Provider, MD   History   Social History   Marital Status: Single    Spouse Name: N/A    Number of Children: N/A   Years of Education: N/A   Occupational History   Not on file.   Social History Main Topics   Smoking status: Never Smoker    Smokeless tobacco: Not on file   Alcohol Use: No   Drug Use: Not on file   Sexual Activity: Not on file   Other Topics Concern   Not on file   Social History Narrative        Review of Systems  Respiratory: Negative for cough and shortness of breath.   Gastrointestinal: Negative for abdominal pain.  Musculoskeletal: Positive for myalgias. Negative for neck pain.  Neurological: Positive for numbness.       Objective:   Physical Exam  Skin:  3 mm elevated nevus on the left breast.   152/72 HEENT: Unremarkable Neck: Supple no adenopathy or thyromegaly Chest: Clear Breast exam reveals no suspicious lesions including examination of the left axilla Heart: Regular no murmur Abdomen: Soft nontender Extremity range of motion is normal, no localized pain in the shoulder  Filed Vitals:   09/10/14 1150  BP: 182/98  Pulse: 65  Temp: 98 F (36.7 C)  TempSrc: Oral  Resp: 18  Height: 5\' 6"  (1.676 m)  Weight: 190 lb (86.183 kg)  SpO2: 98%   EKG: Normal sinus rhythm [facility] reading (PRIMARY) by  Dr. Joseph Art:  Normal chest with stable small pulmonary nodules. Results for orders placed or performed in visit on 09/10/14  POCT CBC  Result Value Ref Range   WBC 3.8 (A) 4.6 - 10.2 K/uL   Lymph, poc 1.7 0.6 - 3.4   POC LYMPH PERCENT 44.8 10 - 50 %L   MID (cbc) 0.2 0 - 0.9   POC MID % 5.1 0 - 12 %M   POC Granulocyte 1.9 (A) 2 -  6.9   Granulocyte percent 50.1 37 - 80 %G   RBC 4.13 4.04 - 5.48 M/uL   Hemoglobin 11.9 (A) 12.2 - 16.2 g/dL   HCT, POC 97.7 (A) 37.7 - 47.9 %   MCV 91.2 80 - 97 fL   MCH, POC 28.7 27 - 31.2 pg   MCHC 31.5 (A) 31.8 - 35.4 g/dL   RDW, POC 14.3 %   Platelet Count, POC 156 142 - 424 K/uL   MPV 9.7 0 - 99.8 fL  ' Results for orders placed or performed in visit on 09/10/14  POCT CBC  Result Value Ref Range   WBC 3.8 (A) 4.6 - 10.2 K/uL   Lymph, poc 1.7 0.6 - 3.4   POC LYMPH PERCENT 44.8 10 - 50 %L   MID (cbc) 0.2 0 - 0.9   POC MID % 5.1 0 - 12 %M   POC Granulocyte 1.9 (A) 2 - 6.9   Granulocyte percent 50.1 37 - 80 %G   RBC 4.13 4.04 - 5.48 M/uL   Hemoglobin 11.9 (A) 12.2 - 16.2 g/dL   HCT, POC 97.7 (A) 37.7 - 47.9 %    MCV 91.2 80 - 97 fL   MCH, POC 28.7 27 - 31.2 pg   MCHC 31.5 (A) 31.8 - 35.4 g/dL   RDW, POC 14.3 %   Platelet Count, POC 156 142 - 424 K/uL   MPV 9.7 0 - 99.8 fL       Assessment & Plan:    This chart was scribed in my presence and reviewed by me personally.    ICD-9-CM ICD-10-CM   1. Shoulder pain, unspecified laterality 719.41 M25.519 POCT CBC     POCT SEDIMENTATION RATE     Comprehensive metabolic panel     TSH     EKG 12-Lead     meloxicam (MOBIC) 7.5 MG tablet  2. Breast pain 611.71 N64.4 DG Chest 2 View     EKG 12-Lead     meloxicam (MOBIC) 7.5 MG tablet  3. Knee pain, left 719.46 M25.562 meloxicam (MOBIC) 7.5 MG tablet     Signed, Robyn Haber, MD

## 2014-09-11 LAB — TSH: TSH: 1.481 u[IU]/mL (ref 0.350–4.500)

## 2014-09-12 ENCOUNTER — Ambulatory Visit
Admission: RE | Admit: 2014-09-12 | Discharge: 2014-09-12 | Disposition: A | Discharge status: Home / Self Care | Payer: Medicare Other | Source: Ambulatory Visit

## 2014-09-12 DIAGNOSIS — Z1231 Encounter for screening mammogram for malignant neoplasm of breast: Secondary | ICD-10-CM

## 2014-10-20 ENCOUNTER — Telehealth: Payer: Self-pay

## 2014-10-20 NOTE — Telephone Encounter (Signed)
Unable to confirm flu shot status

## 2014-11-10 ENCOUNTER — Ambulatory Visit (INDEPENDENT_AMBULATORY_CARE_PROVIDER_SITE_OTHER): Payer: Medicare Other | Admitting: Family Medicine

## 2014-11-10 ENCOUNTER — Encounter: Payer: Self-pay | Admitting: Family Medicine

## 2014-11-10 VITALS — BP 150/90 | HR 84 | Temp 98.6°F | Resp 16 | Ht 66.5 in | Wt 192.0 lb

## 2014-11-10 DIAGNOSIS — R8271 Bacteriuria: Secondary | ICD-10-CM

## 2014-11-10 DIAGNOSIS — R030 Elevated blood-pressure reading, without diagnosis of hypertension: Secondary | ICD-10-CM

## 2014-11-10 DIAGNOSIS — Z0000 Encounter for general adult medical examination without abnormal findings: Secondary | ICD-10-CM | POA: Diagnosis not present

## 2014-11-10 DIAGNOSIS — Z13220 Encounter for screening for lipoid disorders: Secondary | ICD-10-CM

## 2014-11-10 DIAGNOSIS — IMO0001 Reserved for inherently not codable concepts without codable children: Secondary | ICD-10-CM

## 2014-11-10 DIAGNOSIS — E669 Obesity, unspecified: Secondary | ICD-10-CM

## 2014-11-10 DIAGNOSIS — Z862 Personal history of diseases of the blood and blood-forming organs and certain disorders involving the immune mechanism: Secondary | ICD-10-CM

## 2014-11-10 DIAGNOSIS — N390 Urinary tract infection, site not specified: Secondary | ICD-10-CM

## 2014-11-10 DIAGNOSIS — Z23 Encounter for immunization: Secondary | ICD-10-CM

## 2014-11-10 LAB — POCT URINALYSIS DIPSTICK
BILIRUBIN UA: NEGATIVE
Glucose, UA: NEGATIVE
Ketones, UA: NEGATIVE
Nitrite, UA: NEGATIVE
Protein, UA: NEGATIVE
SPEC GRAV UA: 1.02
Urobilinogen, UA: 0.2
pH, UA: 5

## 2014-11-10 LAB — POCT UA - MICROSCOPIC ONLY
CASTS, UR, LPF, POC: NEGATIVE
Crystals, Ur, HPF, POC: NEGATIVE
Mucus, UA: POSITIVE
YEAST UA: POSITIVE

## 2014-11-10 LAB — CBC
HCT: 37.8 % (ref 36.0–46.0)
Hemoglobin: 12 g/dL (ref 12.0–15.0)
MCH: 29 pg (ref 26.0–34.0)
MCHC: 31.7 g/dL (ref 30.0–36.0)
MCV: 91.3 fL (ref 78.0–100.0)
MPV: 11.1 fL (ref 8.6–12.4)
PLATELETS: 215 10*3/uL (ref 150–400)
RBC: 4.14 MIL/uL (ref 3.87–5.11)
RDW: 12.9 % (ref 11.5–15.5)
WBC: 3 10*3/uL — AB (ref 4.0–10.5)

## 2014-11-10 LAB — LIPID PANEL
CHOL/HDL RATIO: 3.3 ratio
Cholesterol: 215 mg/dL — ABNORMAL HIGH (ref 0–200)
HDL: 65 mg/dL (ref >=46)
LDL Cholesterol: 131 mg/dL — ABNORMAL HIGH (ref 0–99)
TRIGLYCERIDES: 94 mg/dL (ref <150)
VLDL: 19 mg/dL (ref 0–40)

## 2014-11-10 NOTE — Progress Notes (Signed)
[facility] [address] [place] [address] [phone]  Date:  11/10/2014   Name:  Tara Jimenez   DOB:  1940-12-08   MRN:  [id number]  PCP:  Gwendolyn Grant, MD    Chief Complaint: Annual Exam and Gynecologic Exam   History of Present Illness:  Tara Jimenez is a 74 y.o. very pleasant female patient who presents with the following:  She is here today for a CPE.  She has recovered well from her subdural hematoma in 2011.  She did have surgery to evacuate the bleed.  She is fasting today for labs.  She has not been dx with HTN per her report - states that she checks her BP regularly at home and that her pressure is generally 120- 130/ 80s.  Her last pap was in 2011. She has never had an abnormal.  She had kept up with her paps prior to then. She would like to DC paps at this time is possible.   BP Readings from Last 3 Encounters:  11/10/14 177/68  09/10/14 182/98  03/17/12 139/74   She has not noted any dark stools or blood in her stools.    We had discussed this issue and her mild anemia back in 2013 but she had not followed up  She would like to get a pneumonia shot today. Last tetanus shot -she is not sure She has always been a bit anemic. This seems to run in her family She did have a colonoscopy in 2011 per [place] Patient Active Problem List   Diagnosis Date Noted  . Subdural hematoma 02/19/2010  . OBESITY 02/15/2010  . PNEUMONIA 02/15/2010  . GERD 02/15/2010  . ARTHRITIS 02/15/2010  . ANKLE INJURY, RIGHT 02/15/2010  . UTI'S, HX OF 02/15/2010    Past Medical History  Diagnosis Date  . Arthritis   . Glaucoma     Past Surgical History  Procedure Laterality Date  . Brain surgery      History  Substance Use Topics  . Smoking status: Never Smoker   . Smokeless tobacco: Not on file  . Alcohol Use: No    Family History  Problem Relation Age of Onset  . Diabetes Brother   . Heart disease Brother   . Hypertension Brother   .  Aneurysm Brother     No Known Allergies  Medication list has been reviewed and updated.  Current Outpatient Prescriptions on File Prior to Visit  Medication Sig Dispense Refill  . tetrahydrozoline 0.05 % ophthalmic solution 1 drop.     No current facility-administered medications on file prior to visit.    Review of Systems:  As per HPI- otherwise negative.   Physical Examination: Filed Vitals:   11/10/14 0936  BP: 177/68  Pulse: 84  Temp: 98.6 F (37 C)  Resp: 16   Filed Vitals:   11/10/14 0936  Height: 5' 6.5" (1.689 m)  Weight: 192 lb (87.091 kg)   Body mass index is 30.53 kg/(m^2). Ideal Body Weight: Weight in (lb) to have BMI = 25: 156.9  GEN: WDWN, NAD, Non-toxic, A & O x 3, overweight, looks well HEENT: Atraumatic, Normocephalic. Neck supple. No masses, No LAD.  Bilateral TM wnl, oropharynx normal.  PEERL,EOMI.   Ears and Nose: No external deformity. CV: RRR, No M/G/R. No JVD. No thrill. No extra heart sounds. PULM: CTA B, no wheezes, crackles, rhonchi. No retractions. No resp. distress. No accessory muscle use. ABD: S, NT, ND. No rebound. No HSM.  Benign belly EXTR: No c/c/e NEURO Normal gait.  PSYCH: Normally interactive. Conversant. Not depressed or anxious appearing.  Calm demeanor.  Breast: normal exam, no masses/ dimpling/ discharge  Results for orders placed or performed in visit on 11/10/14  CBC  Result Value Ref Range   WBC 3.0 (L) 4.0 - 10.5 K/uL   RBC 4.14 3.87 - 5.11 MIL/uL   Hemoglobin 12.0 12.0 - 15.0 g/dL   HCT 37.8 36.0 - 46.0 %   MCV 91.3 78.0 - 100.0 fL   MCH 29.0 26.0 - 34.0 pg   MCHC 31.7 30.0 - 36.0 g/dL   RDW 12.9 11.5 - 15.5 %   Platelets 215 150 - 400 K/uL   MPV 11.1 8.6 - 12.4 fL  Lipid panel  Result Value Ref Range   Cholesterol 215 (H) 0 - 200 mg/dL   Triglycerides 94 <150 mg/dL   HDL 65 >=46 mg/dL   Total CHOL/HDL Ratio 3.3 Ratio   VLDL 19 0 - 40 mg/dL   LDL Cholesterol 131 (H) 0 - 99 mg/dL  POCT urinalysis  dipstick  Result Value Ref Range   Color, UA yellow    Clarity, UA clear    Glucose, UA neg    Bilirubin, UA neg    Ketones, UA neg    Spec Grav, UA 1.020    Blood, UA trace    pH, UA 5.0    Protein, UA neg    Urobilinogen, UA 0.2    Nitrite, UA neg    Leukocytes, UA small (1+)   POCT UA - Microscopic Only  Result Value Ref Range   WBC, Ur, HPF, POC 3-4    RBC, urine, microscopic 3-6    Bacteria, U Microscopic 2+    Mucus, UA positive    Epithelial cells, urine per micros 2-3    Crystals, Ur, HPF, POC neg    Casts, Ur, LPF, POC neg    Yeast, UA positive      Assessment and Plan: Annual physical exam - Plan: POCT urinalysis dipstick  History of anemia - Plan: CBC  Screening for hyperlipidemia - Plan: Lipid panel  Elevated blood pressure - Plan: Lipid panel  Immunization due - Plan: Pneumococcal conjugate vaccine 13-valent IM, Td vaccine greater than or equal to 7yo preservative free IM  Obesity - Plan: Lipid panel  Await her labs Updated Td and prevnar today Discussed her history of anemia. Check CBC today.  If she continues to be anemic consider having her see GI for a colonoscopy sooner than 10 year interval   Signed Lamar Blinks, MD  Called her 4/6 to discuss.  Her urine appears to be possibly infected. She denies any sx of UTI and declines an abx.  However she did agree to come by and drop off a repeat urine sample in a week or so.  I will order this for her

## 2014-11-10 NOTE — Patient Instructions (Signed)
Please continue to check your BP at home at least twice a week  Please let me know if you are running over 130/85; in that case we should think about using a medication to control your pressure I will be in touch with your labs. If you continue to be anemic we should think about having you see GI sooner than 5 years  You got your tetanus and pneumonia vaccines today (Td and prevnar) You will need a second pneumonia shot next year.    Pneumococcal Conjugate Vaccine: What You Need to Know Your doctor recommends that you, or your child, get a dose of PCV13 today. 1. Why get vaccinated? Pneumococcal conjugate vaccine (called PCV13 or Prevnar 13) is recommended to protect infants and toddlers, and some older children and adults with certain health conditions, from pneumococcal disease. Pneumococcal disease is caused by infection with Streptococcus pneumoniae bacteria. These bacteria can spread from person to person through close contact. Pneumococcal disease can lead to severe health problems, including pneumonia, blood infections, and meningitis. Meningitis is an infection of the covering of the brain. Pneumococcal meningitis is fairly rare (less than 1 case per 100,000 people each year), but it leads to other health problems, including deafness and brain damage. In children, it is fatal in about 1 case out of 10. Children younger than two are at higher risk for serious disease than older children. People with certain medical conditions, people over age 73, and cigarette smokers are also at higher risk. Before vaccine, pneumococcal infections caused many problems each year in the [place] in children younger than 5, including:  more than 700 cases of meningitis,  13,000 blood infections,  about 5 million ear infections, and  about 200 deaths. About 4,000 adults still die each year because of pneumococcal infections. Pneumococcal infections can be hard to treat because some strains are  resistant to antibiotics. This makes prevention through vaccination even more important. 2. PCV13 vaccine There are more than 90 types of pneumococcal bacteria. PCV13 protects against 13 of them. These 13 strains cause most severe infections in children and about half of infections in adults.  PCV13 is routinely given to children at 2, 4, 6, and 97-55 months of age. Children in this age range are at greatest risk for serious diseases caused by pneumococcal infection. PCV13 vaccine may also be recommended for some older children or adults. Your doctor can give you details. A second type of pneumococcal vaccine, called PPSV23, may also be given to some children and adults, including anyone over age 48. There is a separate Vaccine Information Statement for this vaccine. 3. Precautions  Anyone who has ever had a life-threatening allergic reaction to a dose of this vaccine, to an earlier pneumococcal vaccine called PCV7 (or Prevnar), or to any vaccine containing diphtheria toxoid (for example, DTaP), should not get PCV13. Anyone with a severe allergy to any component of PCV13 should not get the vaccine. Tell your doctor if the person being vaccinated has any severe allergies. If the person scheduled for vaccination is sick, your doctor might decide to reschedule the shot on another day. Your doctor can give you more information about any of these precautions. 4. What are the risks of PCV13 vaccine?  With any medicine, including vaccines, there is a chance of side effects. These are usually mild and go away on their own, but serious reactions are also possible. Reported problems associated with PCV13 vary by dose and age, but generally:  About half of children  became drowsy after the shot, had a temporary loss of appetite, or had redness or tenderness where the shot was given.  About 1 out of 3 had swelling where the shot was given.  About 1 out of 3 had a mild fever, and about 1 in 20 had a higher  fever (over 102.61F).  Up to about 8 out of 10 became fussy or irritable. Adults receiving the vaccine have reported redness, pain, and swelling where the shot was given. Mild fever, fatigue, headache, chills, or muscle pain have also been reported. Life-threatening allergic reactions from any vaccine are very rare. 5. What if there is a serious reaction? What should I look for?  Look for anything that concerns you, such as signs of a severe allergic reaction, very high fever, or behavior changes. Signs of a severe allergic reaction can include hives, swelling of the face and throat, difficulty breathing, a fast heartbeat, dizziness, and weakness. These would start a few minutes to a few hours after the vaccination. What should I do?  If you think it is a severe allergic reaction or other emergency that can't wait, call 9-1-1 or get the person to the nearest hospital. Otherwise, call your doctor.  Afterward, the reaction should be reported to the Vaccine Adverse Event Reporting System (VAERS). Your doctor might file this report, or you can do it yourself through the VAERS web site at www.vaers.SamedayNews.es, or by calling 5800983550. VAERS is only for reporting reactions. They do not give medical advice. 6. The National Vaccine Injury Compensation Program The [employer] Vaccine Injury Compensation Program (VICP) is a federal program that was created to compensate people who may have been injured by certain vaccines. Persons who believe they may have been injured by a vaccine can learn about the program and about filing a claim by calling 940-712-1951 or visiting the [place] website at GoldCloset.com.ee. 7. How can I learn more?  Ask your doctor.  Call your local or state health department.  Contact the Centers for Disease Control and Prevention (CDC):  Call 364-842-0547 (1-800-CDC-INFO) or  Visit CDC's website at http://hunter.com/ CDC PCV13 Vaccine VIS (Interim)  (10/05/11) Document Released: 05/22/2006 Document Revised: 12/09/2013 Document Reviewed: 09/13/2013 [facility] Patient Information 2015 [place], [place]. This information is not intended to replace advice given to you by your health care provider. Make sure you discuss any questions you have with your health care provider. Td Vaccine (Tetanus and Diphtheria): What You Need to Know 1. Why get vaccinated? Tetanus  and diphtheria are very serious diseases. They are rare in the [place] today, but people who do become infected often have severe complications. Td vaccine is used to protect adolescents and adults from both of these diseases. Both tetanus and diphtheria are infections caused by bacteria. Diphtheria spreads from person to person through coughing or sneezing. Tetanus-causing bacteria enter the body through cuts, scratches, or wounds. TETANUS (Lockjaw) causes painful muscle tightening and stiffness, usually all over the body.  It can lead to tightening of muscles in the head and neck so you can't open your mouth, swallow, or sometimes even breathe. Tetanus kills about 1 out of every 5 people who are infected. DIPHTHERIA can cause a thick coating to form in the back of the throat.  It can lead to breathing problems, paralysis, heart failure, and death. Before vaccines, the [place] saw as many as 200,000 cases a year of diphtheria and hundreds of cases of tetanus. Since vaccination began, cases of both diseases have dropped by  about 99%. 2. Td vaccine Td vaccine can protect adolescents and adults from tetanus and diphtheria. Td is usually given as a booster dose every 10 years but it can also be given earlier after a severe and dirty wound or burn. Your doctor can give you more information. Td may safely be given at the same time as other vaccines. 3. Some people should not get this vaccine  If you ever had a life-threatening allergic reaction after a dose of any tetanus or diphtheria  containing vaccine, OR if you have a severe allergy to any part of this vaccine, you should not get Td. Tell your doctor if you have any severe allergies.  Talk to your doctor if you:  have epilepsy or another nervous system problem,  had severe pain or swelling after any vaccine containing diphtheria or tetanus,  ever had Guillain Barr Syndrome (GBS),  aren't feeling well on the day the shot is scheduled. 4. Risks of a vaccine reaction With a vaccine, like any medicine, there is a chance of side effects. These are usually mild and go away on their own. Serious side effects are also possible, but are very rare. Most people who get Td vaccine do not have any problems with it. Mild Problems  following Td (Did not interfere with activities)  Pain where the shot was given (about 8 people in 10)  Redness or swelling where the shot was given (about 1 person in 3)  Mild fever (about 1 person in 15)  Headache or Tiredness (uncommon) Moderate Problems following Td (Interfered with activities, but did not require medical attention)  Fever over 102F (rare) Severe Problems  following Td (Unable to perform usual activities; required medical attention)  Swelling, severe pain, bleeding and/or redness in the arm where the shot was given (rare). Problems that could happen after any vaccine:  Brief fainting spells can happen after any medical procedure, including vaccination. Sitting or lying down for about 15 minutes can help prevent fainting, and injuries caused by a fall. Tell your doctor if you feel dizzy, or have vision changes or ringing in the ears.  Severe shoulder pain and reduced range of motion in the arm where a shot was given can happen, very rarely, after a vaccination.  Severe allergic reactions from a vaccine are very rare, estimated at less than 1 in a million doses. If one were to occur, it would usually be within a few minutes to a few hours after the vaccination. 5. What  if there is a serious reaction? What should I look for?  Look for anything that concerns you, such as signs of a severe allergic reaction, very high fever, or behavior changes. Signs of a severe allergic reaction can include hives, swelling of the face and throat, difficulty breathing, a fast heartbeat, dizziness, and weakness. These would usually start a few minutes to a few hours after the vaccination. What should I do?  If you think it is a severe allergic reaction or other emergency that can't wait, call 9-1-1 or get the person to the nearest hospital. Otherwise, call your doctor.  Afterward, the reaction should be reported to the Vaccine Adverse Event Reporting System (VAERS). Your doctor might file this report, or you can do it yourself through the VAERS web site at www.vaers.SamedayNews.es, or by calling (708) 788-9523. VAERS is only for reporting reactions. They do not give medical advice. 6. The National Vaccine Injury Compensation Program The [employer] Vaccine Injury Compensation Program (VICP) is a [employer]  that was created to compensate people who may have been injured by certain vaccines. Persons who believe they may have been injured by a vaccine can learn about the program and about filing a claim by calling (772)209-5904 or visiting the [place] website at GoldCloset.com.ee. 7. How can I learn more?  Ask your doctor.  Contact your local or state health department.  Contact the Centers for Disease Control and Prevention (CDC):  Call 778-201-2313 (1-800-CDC-INFO)  Visit CDC's website at http://hunter.com/ CDC Td Vaccine Interim VIS (09/11/12) Document Released: 05/22/2006 Document Revised: 12/09/2013 Document Reviewed: 11/06/2013 [facility] Patient Information 2015 [place], [place]. This information is not intended to replace advice given to you by your health care provider. Make sure you discuss any questions you have with your health care provider.

## 2014-11-12 ENCOUNTER — Encounter: Payer: Self-pay | Admitting: Family Medicine

## 2014-11-21 ENCOUNTER — Telehealth: Payer: Self-pay

## 2014-11-21 MED ORDER — NITROFURANTOIN MONOHYD MACRO 100 MG PO CAPS: 100.0000 mg | ORAL_CAPSULE | Freq: Two times a day (BID) | ORAL | Status: DC

## 2014-11-21 NOTE — Telephone Encounter (Signed)
Discussed case with Dr Laney Pastor. Agree to send in pt macrobid at this time. Take twice daily for 5 days. If her symptoms continue despite this treatment she needs to return to clinic so we can assess her and likely get another urine sample with a culture.

## 2014-11-21 NOTE — Telephone Encounter (Signed)
Pt saw Darreld Mclean, MD at 11/10/2014 9:49 AM , and was offered an RX for a UTI, Patient at the time refused, but now feels that she needs this. I am going to place on high priority as she has already been seen on our office, and spoke with Dr. Lorelei Pont.

## 2014-11-21 NOTE — Telephone Encounter (Signed)
Signed Lamar Blinks, MD  Called her 4/6 to discuss. Her urine appears to be possibly infected. She denies any sx of UTI and declines an abx. However she did agree to come by and drop off a repeat urine sample in a week or so. I will order this for her

## 2014-11-22 NOTE — Telephone Encounter (Signed)
Pt.notified

## 2014-12-09 ENCOUNTER — Other Ambulatory Visit (INDEPENDENT_AMBULATORY_CARE_PROVIDER_SITE_OTHER): Payer: Medicare Other | Admitting: *Deleted

## 2014-12-09 ENCOUNTER — Other Ambulatory Visit: Payer: Self-pay | Admitting: Family Medicine

## 2014-12-09 DIAGNOSIS — N390 Urinary tract infection, site not specified: Secondary | ICD-10-CM | POA: Diagnosis not present

## 2014-12-09 DIAGNOSIS — R8299 Other abnormal findings in urine: Secondary | ICD-10-CM | POA: Diagnosis not present

## 2014-12-09 DIAGNOSIS — R8271 Bacteriuria: Secondary | ICD-10-CM

## 2014-12-09 LAB — POCT URINALYSIS DIPSTICK
Bilirubin, UA: NEGATIVE
GLUCOSE UA: NEGATIVE
Ketones, UA: NEGATIVE
NITRITE UA: NEGATIVE
Protein, UA: NEGATIVE
Spec Grav, UA: 1.025
UROBILINOGEN UA: 0.2
pH, UA: 5

## 2014-12-09 LAB — POCT UA - MICROSCOPIC ONLY
CRYSTALS, UR, HPF, POC: NEGATIVE
Casts, Ur, LPF, POC: NEGATIVE
Mucus, UA: NEGATIVE
Yeast, UA: NEGATIVE

## 2014-12-10 ENCOUNTER — Other Ambulatory Visit: Payer: Self-pay | Admitting: Family Medicine

## 2014-12-10 DIAGNOSIS — R3129 Other microscopic hematuria: Secondary | ICD-10-CM

## 2014-12-10 LAB — URINE CULTURE: Colony Count: 9000

## 2014-12-10 NOTE — Progress Notes (Addendum)
Received her repeat urine.  She continues to have microhematuria.  She feels well.  Will refer to urology- she is ok with this plan   Results for orders placed or performed in visit on 12/09/14  Urine culture  Result Value Ref Range   Colony Count 9,000 COLONIES/ML    Organism ID, Bacteria Insignificant Growth   POCT urinalysis dipstick  Result Value Ref Range   Color, UA yellow    Clarity, UA cloudy    Glucose, UA neg    Bilirubin, UA neg    Ketones, UA neg    Spec Grav, UA 1.025    Blood, UA trace    pH, UA 5.0    Protein, UA neg    Urobilinogen, UA 0.2    Nitrite, UA neg    Leukocytes, UA moderate (2+)   POCT UA - Microscopic Only  Result Value Ref Range   WBC, Ur, HPF, POC 9-15    RBC, urine, microscopic 7-9    Bacteria, U Microscopic 2+    Mucus, UA neg    Epithelial cells, urine per micros 8-15    Crystals, Ur, HPF, POC neg    Casts, Ur, LPF, POC neg    Yeast, UA neg

## 2014-12-13 ENCOUNTER — Ambulatory Visit (INDEPENDENT_AMBULATORY_CARE_PROVIDER_SITE_OTHER): Payer: Medicare Other | Admitting: Family Medicine

## 2014-12-13 ENCOUNTER — Telehealth: Payer: Self-pay | Admitting: Family Medicine

## 2014-12-13 VITALS — BP 140/66 | HR 72 | Temp 98.5°F | Resp 20 | Ht 66.25 in | Wt 192.4 lb

## 2014-12-13 DIAGNOSIS — N949 Unspecified condition associated with female genital organs and menstrual cycle: Secondary | ICD-10-CM

## 2014-12-13 DIAGNOSIS — R3129 Other microscopic hematuria: Secondary | ICD-10-CM

## 2014-12-13 LAB — POCT WET PREP WITH KOH
CLUE CELLS WET PREP PER HPF POC: NEGATIVE
KOH Prep POC: POSITIVE
RBC Wet Prep HPF POC: NEGATIVE
Trichomonas, UA: NEGATIVE
YEAST WET PREP PER HPF POC: POSITIVE

## 2014-12-13 MED ORDER — FLUCONAZOLE 150 MG PO TABS: 150.0000 mg | ORAL_TABLET | Freq: Once | ORAL | Status: DC

## 2014-12-13 NOTE — Progress Notes (Signed)
[facility] [address], [place] [address] [phone]  Date:  12/13/2014   Name:  Tara Jimenez   DOB:  08-19-1940   MRN:  [id number]  PCP:  Gwendolyn Grant, MD    Chief Complaint: Burning on outside of vagina   History of Present Illness:  Tara Jimenez is a 74 y.o. very pleasant female patient who presents with the following:  She has noted a  Burning feeling in her vulva that comes and goes.  She was not sure if this might be just hot flashes but she will have this even if her hot flashes are OW quiet.  These have been present for about 2 months.  She has not noted any discharge, no itching.    We have checked her urine twice- negative cultures with mild microhematuria so I have referred her to urology.    She is generally in good health  Patient Active Problem List   Diagnosis Date Noted  . Subdural hematoma 02/19/2010  . OBESITY 02/15/2010  . PNEUMONIA 02/15/2010  . GERD 02/15/2010  . ARTHRITIS 02/15/2010  . ANKLE INJURY, RIGHT 02/15/2010  . UTI'S, HX OF 02/15/2010    Past Medical History  Diagnosis Date  . Arthritis   . Glaucoma     Past Surgical History  Procedure Laterality Date  . Brain surgery      History  Substance Use Topics  . Smoking status: Never Smoker   . Smokeless tobacco: Never Used  . Alcohol Use: No    Family History  Problem Relation Age of Onset  . Diabetes Brother   . Heart disease Brother   . Hypertension Brother   . Aneurysm Brother     No Known Allergies  Medication list has been reviewed and updated.  No current outpatient prescriptions on file prior to visit.   No current facility-administered medications on file prior to visit.    Review of Systems:  As per HPI- otherwise negative.   Physical Examination: Filed Vitals:   12/13/14 1233  BP: 140/66  Pulse: 72  Temp: 98.5 F (36.9 C)  Resp: 20   Filed Vitals:   12/13/14 1233  Height: 5' 6.25" (1.683 m)  Weight: 192 lb 6 oz  (87.261 kg)   Body mass index is 30.81 kg/(m^2). Ideal Body Weight: Weight in (lb) to have BMI = 25: 155.7  GEN: WDWN, NAD, Non-toxic, A & O x 3, overweight, looks well HEENT: Atraumatic, Normocephalic. Neck supple. No masses, No LAD. Ears and Nose: No external deformity. CV: RRR, No M/G/R. No JVD. No thrill. No extra heart sounds. PULM: CTA B, no wheezes, crackles, rhonchi. No retractions. No resp. distress. No accessory muscle use. ABD: S, NT, ND EXTR: No c/c/e NEURO Normal gait.  PSYCH: Normally interactive. Conversant. Not depressed or anxious appearing.  Calm demeanor.  GU: thinning and slight inflammation of the vulvar tissues. Otherwise no abnormal lesion, discharge, no CMT   Results for orders placed or performed in visit on 12/13/14  POCT Wet Prep with KOH  Result Value Ref Range   Trichomonas, UA Negative    Clue Cells Wet Prep HPF POC neg    Epithelial Wet Prep HPF POC 3-6    Yeast Wet Prep HPF POC positive    Bacteria Wet Prep HPF POC 2+    RBC Wet Prep HPF POC neg    WBC Wet Prep HPF POC 3-6    KOH Prep POC Positive     Assessment  and Plan: Vaginal burning - Plan: POCT Wet Prep with KOH, fluconazole (DIFLUCAN) 150 MG tablet  Diflucan for yeast vaginitis- however if not better she will let me know, consider estrogen cream in that case  Signed Lamar Blinks, MD

## 2014-12-13 NOTE — Telephone Encounter (Signed)
Called her regarding her repeat urine. She does not have a UTI, and she does have red and white cells.  Will refer to urology for eval. She notes vaginal irritation and plans to come in today for an exam with me  Results for orders placed or performed in visit on 12/09/14  Urine culture  Result Value Ref Range   Colony Count 9,000 COLONIES/ML    Organism ID, Bacteria Insignificant Growth   POCT urinalysis dipstick  Result Value Ref Range   Color, UA yellow    Clarity, UA cloudy    Glucose, UA neg    Bilirubin, UA neg    Ketones, UA neg    Spec Grav, UA 1.025    Blood, UA trace    pH, UA 5.0    Protein, UA neg    Urobilinogen, UA 0.2    Nitrite, UA neg    Leukocytes, UA moderate (2+)   POCT UA - Microscopic Only  Result Value Ref Range   WBC, Ur, HPF, POC 9-15    RBC, urine, microscopic 7-9    Bacteria, U Microscopic 2+    Mucus, UA neg    Epithelial cells, urine per micros 8-15    Crystals, Ur, HPF, POC neg    Casts, Ur, LPF, POC neg    Yeast, UA neg

## 2014-12-13 NOTE — Patient Instructions (Signed)
It appears that you have a vaginal yeast infection Use the diflucan pill as directed- now and repeat in one week. However if your symptoms do not go away we may want to try a vaignal estrogen cream as well Let me know if your symptoms do not go away I will set up an appt for your to see a urologist regarding the small amount of blood in your urine.

## 2014-12-16 DIAGNOSIS — H4011X1 Primary open-angle glaucoma, mild stage: Secondary | ICD-10-CM | POA: Diagnosis not present

## 2015-02-03 DIAGNOSIS — R312 Other microscopic hematuria: Secondary | ICD-10-CM | POA: Diagnosis not present

## 2015-02-06 ENCOUNTER — Encounter: Payer: Self-pay | Admitting: Family Medicine

## 2015-02-06 DIAGNOSIS — R3129 Other microscopic hematuria: Secondary | ICD-10-CM | POA: Insufficient documentation

## 2015-02-20 DIAGNOSIS — K5730 Diverticulosis of large intestine without perforation or abscess without bleeding: Secondary | ICD-10-CM | POA: Diagnosis not present

## 2015-02-20 DIAGNOSIS — K449 Diaphragmatic hernia without obstruction or gangrene: Secondary | ICD-10-CM | POA: Diagnosis not present

## 2015-02-20 DIAGNOSIS — R312 Other microscopic hematuria: Secondary | ICD-10-CM | POA: Diagnosis not present

## 2015-02-20 DIAGNOSIS — N289 Disorder of kidney and ureter, unspecified: Secondary | ICD-10-CM | POA: Diagnosis not present

## 2015-02-24 DIAGNOSIS — R312 Other microscopic hematuria: Secondary | ICD-10-CM | POA: Diagnosis not present

## 2015-06-05 ENCOUNTER — Telehealth: Payer: Self-pay | Admitting: Family Medicine

## 2015-06-05 NOTE — Telephone Encounter (Signed)
SPOKE WITH PATIENT AND SHE IS COMING IN ON Wednesday November 2 AT 9AM TO CLOSE GAPS IN CARE.  SHE NEEDS BMI, FLU SHOT AND SCREENING FOR HIGH BLOOD PRESSURE AND DOCUMENTATION

## 2015-06-10 ENCOUNTER — Ambulatory Visit: Payer: Self-pay | Admitting: Family Medicine

## 2015-06-15 ENCOUNTER — Encounter: Payer: Self-pay | Admitting: Gastroenterology

## 2015-06-17 ENCOUNTER — Ambulatory Visit: Payer: Self-pay | Admitting: Family Medicine

## 2015-06-17 DIAGNOSIS — H401111 Primary open-angle glaucoma, right eye, mild stage: Secondary | ICD-10-CM | POA: Diagnosis not present

## 2015-06-17 DIAGNOSIS — H401122 Primary open-angle glaucoma, left eye, moderate stage: Secondary | ICD-10-CM | POA: Diagnosis not present

## 2015-06-24 ENCOUNTER — Encounter: Payer: Self-pay | Admitting: Family Medicine

## 2015-06-24 ENCOUNTER — Ambulatory Visit (INDEPENDENT_AMBULATORY_CARE_PROVIDER_SITE_OTHER): Payer: Medicare Other | Admitting: Family Medicine

## 2015-06-24 VITALS — BP 160/80 | HR 75 | Temp 98.4°F | Resp 18 | Ht 66.25 in | Wt 190.0 lb

## 2015-06-24 DIAGNOSIS — Z23 Encounter for immunization: Secondary | ICD-10-CM | POA: Diagnosis not present

## 2015-06-24 DIAGNOSIS — Z131 Encounter for screening for diabetes mellitus: Secondary | ICD-10-CM

## 2015-06-24 DIAGNOSIS — E663 Overweight: Secondary | ICD-10-CM | POA: Diagnosis not present

## 2015-06-24 DIAGNOSIS — D72829 Elevated white blood cell count, unspecified: Secondary | ICD-10-CM | POA: Diagnosis not present

## 2015-06-24 DIAGNOSIS — D72819 Decreased white blood cell count, unspecified: Secondary | ICD-10-CM | POA: Diagnosis not present

## 2015-06-24 DIAGNOSIS — R030 Elevated blood-pressure reading, without diagnosis of hypertension: Secondary | ICD-10-CM | POA: Diagnosis not present

## 2015-06-24 LAB — COMPREHENSIVE METABOLIC PANEL
ALBUMIN: 4 g/dL (ref 3.6–5.1)
ALT: 13 U/L (ref 6–29)
AST: 15 U/L (ref 10–35)
Alkaline Phosphatase: 53 U/L (ref 33–130)
BUN: 12 mg/dL (ref 7–25)
CHLORIDE: 107 mmol/L (ref 98–110)
CO2: 24 mmol/L (ref 20–31)
Calcium: 8.8 mg/dL (ref 8.6–10.4)
Creat: 0.79 mg/dL (ref 0.60–0.93)
Glucose, Bld: 72 mg/dL (ref 65–99)
POTASSIUM: 4 mmol/L (ref 3.5–5.3)
Sodium: 140 mmol/L (ref 135–146)
Total Bilirubin: 0.5 mg/dL (ref 0.2–1.2)
Total Protein: 7.1 g/dL (ref 6.1–8.1)

## 2015-06-24 LAB — CBC
HEMATOCRIT: 37.1 % (ref 36.0–46.0)
HEMOGLOBIN: 11.8 g/dL — AB (ref 12.0–15.0)
MCH: 29.1 pg (ref 26.0–34.0)
MCHC: 31.8 g/dL (ref 30.0–36.0)
MCV: 91.6 fL (ref 78.0–100.0)
MPV: 11.5 fL (ref 8.6–12.4)
Platelets: 192 10*3/uL (ref 150–400)
RBC: 4.05 MIL/uL (ref 3.87–5.11)
RDW: 13.4 % (ref 11.5–15.5)
WBC: 2.5 10*3/uL — AB (ref 4.0–10.5)

## 2015-06-24 NOTE — Patient Instructions (Signed)
It was good to see you today- have a great holiday season I will be in touch with your labs asap Continue to watch your blood pressure at home- the next time we see you please bring your cuff so we can make sure it is accurate  Continue exercise this winter- walking indoors can be a good option for cold days!   If you have not yet had a shingles vaccine (zostavax) I would recommend that you get this at your convenience at your drug store  Please come and see me in about 6 months

## 2015-06-24 NOTE — Progress Notes (Addendum)
[facility] [address] [place] [address] [phone]  Date:  06/24/2015   Name:  Tara Jimenez   DOB:  1940-09-05   MRN:  [id number]  PCP:  Gwendolyn Grant, MD    Chief Complaint: Follow-up   History of Present Illness:  Tara Jimenez is a 74 y.o. very pleasant female patient who presents with the following:  Here today for a follow-up appt History of obesity, subdural hematoma in 2011 Her weight is stable She is doing well overall.   She checks her BP at home- yesterday it was 132/60s.  She checks it once a week and it is always ok per her home cuff  She admits that she is not really working on her weight at home. She does not wish to see a nutrionist at this time She does exercise by working in her yard. However as the colder months come she knows she will need to do more walking She would like to do her flu shot today  She is fasting today for labs   She is menopausal- never had a bone density scan but declines this today  Wt Readings from Last 3 Encounters:  06/24/15 190 lb (86.183 kg)  12/13/14 192 lb 6 oz (87.261 kg)  11/10/14 192 lb (87.091 kg)     BP Readings from Last 3 Encounters:  06/24/15 173/68  12/13/14 140/66  11/10/14 150/90     Patient Active Problem List   Diagnosis Date Noted  . Microscopic hematuria 02/06/2015  . Subdural hematoma ([place]) 02/19/2010  . OBESITY 02/15/2010  . PNEUMONIA 02/15/2010  . GERD 02/15/2010  . ARTHRITIS 02/15/2010  . ANKLE INJURY, RIGHT 02/15/2010  . UTI'S, HX OF 02/15/2010    Past Medical History  Diagnosis Date  . Arthritis   . Glaucoma     Past Surgical History  Procedure Laterality Date  . Brain surgery      Social History  Substance Use Topics  . Smoking status: Never Smoker   . Smokeless tobacco: Never Used  . Alcohol Use: No    Family History  Problem Relation Age of Onset  . Diabetes Brother   . Heart disease Brother   . Hypertension Brother   . Aneurysm  Brother     No Known Allergies  Medication list has been reviewed and updated.  Current Outpatient Prescriptions on File Prior to Visit  Medication Sig Dispense Refill  . latanoprost (XALATAN) 0.005 % ophthalmic solution Place 1 drop into both eyes at bedtime.      No current facility-administered medications on file prior to visit.    Review of Systems:  As per HPI- otherwise negative.    Physical Examination: Filed Vitals:   06/24/15 1015  BP: 173/68  Pulse: 75  Temp: 98.4 F (36.9 C)  Resp: 18   Filed Vitals:   06/24/15 1015  Height: 5' 6.25" (1.683 m)  Weight: 190 lb (86.183 kg)   Body mass index is 30.43 kg/(m^2). Ideal Body Weight: Weight in (lb) to have BMI = 25: 155.7  GEN: WDWN, NAD, Non-toxic, A & O x 3, obese, looks well HEENT: Atraumatic, Normocephalic. Neck supple. No masses, No LAD. Ears and Nose: No external deformity. CV: RRR, No M/G/R. No JVD. No thrill. No extra heart sounds. PULM: CTA B, no wheezes, crackles, rhonchi. No retractions. No resp. distress. No accessory muscle use. EXTR: No c/c/e NEURO Normal gait.  PSYCH: Normally interactive. Conversant. Not depressed or anxious appearing.  Calm demeanor.  Assessment and Plan: Immunization due - Plan: Flu Vaccine QUAD 36+ mos IM  Elevated BP - Plan: Comprehensive metabolic panel  Leukocytosis - Plan: CBC  Screening for diabetes mellitus  Overweight  Flu shot Rechecked her BP- it is better.  Per her report her BP is always fine at home; asked her to please bring in her cuff next visit so we can check it against ours Other labs pending Noted low wbc count; recheck CBC   Signed Lamar Blinks, MD  Called to go over her labs 11/7- no answer.  Will try her back.  Want to refer to hematology due to worsening leukopenia Called again 11/18- no answer  Results for orders placed or performed in visit on 06/24/15  Comprehensive metabolic panel  Result Value Ref Range   Sodium 140 135 -  146 mmol/L   Potassium 4.0 3.5 - 5.3 mmol/L   Chloride 107 98 - 110 mmol/L   CO2 24 20 - 31 mmol/L   Glucose, Bld 72 65 - 99 mg/dL   BUN 12 7 - 25 mg/dL   Creat 0.79 0.60 - 0.93 mg/dL   Total Bilirubin 0.5 0.2 - 1.2 mg/dL   Alkaline Phosphatase 53 33 - 130 U/L   AST 15 10 - 35 U/L   ALT 13 6 - 29 U/L   Total Protein 7.1 6.1 - 8.1 g/dL   Albumin 4.0 3.6 - 5.1 g/dL   Calcium 8.8 8.6 - 10.4 mg/dL  CBC  Result Value Ref Range   WBC 2.5 (L) 4.0 - 10.5 K/uL   RBC 4.05 3.87 - 5.11 MIL/uL   Hemoglobin 11.8 (L) 12.0 - 15.0 g/dL   HCT 37.1 36.0 - 46.0 %   MCV 91.6 78.0 - 100.0 fL   MCH 29.1 26.0 - 34.0 pg   MCHC 31.8 30.0 - 36.0 g/dL   RDW 13.4 11.5 - 15.5 %   Platelets 192 150 - 400 K/uL   MPV 11.5 8.6 - 12.4 fL   11.19- called her to go over labs.  Let her know that her wbc is a bit low- I am going to go ahead and refer her to hematology at this point.  She states understanding

## 2015-06-27 ENCOUNTER — Encounter: Payer: Self-pay | Admitting: Family Medicine

## 2015-06-27 NOTE — Addendum Note (Signed)
Addended by: Lamar Blinks C on: 06/27/2015 05:32 PM   Modules accepted: Orders

## 2015-07-08 ENCOUNTER — Telehealth: Payer: Self-pay

## 2015-07-08 NOTE — Telephone Encounter (Signed)
Pt called to inq. About hemotology referral please contact her to advise.  [phone]

## 2015-07-10 ENCOUNTER — Telehealth: Payer: Self-pay | Admitting: Hematology

## 2015-07-10 NOTE — Telephone Encounter (Signed)
Pt returned call regarding new pt referral. Confirmed appt and mailed out new pt packet

## 2015-07-22 ENCOUNTER — Ambulatory Visit (HOSPITAL_BASED_OUTPATIENT_CLINIC_OR_DEPARTMENT_OTHER): Payer: Medicare Other | Admitting: Hematology

## 2015-07-22 ENCOUNTER — Telehealth: Payer: Self-pay | Admitting: Oncology

## 2015-07-22 ENCOUNTER — Encounter: Payer: Self-pay | Admitting: Hematology

## 2015-07-22 ENCOUNTER — Ambulatory Visit (HOSPITAL_BASED_OUTPATIENT_CLINIC_OR_DEPARTMENT_OTHER): Payer: Medicare Other

## 2015-07-22 VITALS — BP 150/64 | HR 81 | Temp 98.6°F | Resp 20 | Ht 66.25 in | Wt 189.1 lb

## 2015-07-22 DIAGNOSIS — M899 Disorder of bone, unspecified: Secondary | ICD-10-CM

## 2015-07-22 DIAGNOSIS — M898X9 Other specified disorders of bone, unspecified site: Secondary | ICD-10-CM

## 2015-07-22 DIAGNOSIS — R202 Paresthesia of skin: Secondary | ICD-10-CM

## 2015-07-22 DIAGNOSIS — D472 Monoclonal gammopathy: Secondary | ICD-10-CM

## 2015-07-22 DIAGNOSIS — D72819 Decreased white blood cell count, unspecified: Secondary | ICD-10-CM | POA: Diagnosis not present

## 2015-07-22 LAB — CBC & DIFF AND RETIC
BASO%: 0 % (ref 0.0–2.0)
BASOS ABS: 0 10*3/uL (ref 0.0–0.1)
EOS%: 1 % (ref 0.0–7.0)
Eosinophils Absolute: 0 10*3/uL (ref 0.0–0.5)
HCT: 37.9 % (ref 34.8–46.6)
HGB: 12 g/dL (ref 11.6–15.9)
IMMATURE RETIC FRACT: 2.8 % (ref 1.60–10.00)
LYMPH#: 1.2 10*3/uL (ref 0.9–3.3)
LYMPH%: 40.6 % (ref 14.0–49.7)
MCH: 29.1 pg (ref 25.1–34.0)
MCHC: 31.7 g/dL (ref 31.5–36.0)
MCV: 91.8 fL (ref 79.5–101.0)
MONO#: 0.2 10*3/uL (ref 0.1–0.9)
MONO%: 7.8 % (ref 0.0–14.0)
NEUT#: 1.5 10*3/uL (ref 1.5–6.5)
NEUT%: 50.6 % (ref 38.4–76.8)
Platelets: 194 10*3/uL (ref 145–400)
RBC: 4.13 10*6/uL (ref 3.70–5.45)
RDW: 13.6 % (ref 11.2–14.5)
RETIC CT ABS: 45.43 10*3/uL (ref 33.70–90.70)
Retic %: 1.1 % (ref 0.70–2.10)
WBC: 2.9 10*3/uL — ABNORMAL LOW (ref 3.9–10.3)

## 2015-07-22 LAB — COMPREHENSIVE METABOLIC PANEL
ALT: 16 U/L (ref 0–55)
AST: 18 U/L (ref 5–34)
Albumin: 3.8 g/dL (ref 3.5–5.0)
Alkaline Phosphatase: 54 U/L (ref 40–150)
Anion Gap: 8 mEq/L (ref 3–11)
BUN: 12.2 mg/dL (ref 7.0–26.0)
CHLORIDE: 111 meq/L — AB (ref 98–109)
CO2: 23 meq/L (ref 22–29)
CREATININE: 0.9 mg/dL (ref 0.6–1.1)
Calcium: 9.2 mg/dL (ref 8.4–10.4)
EGFR: 78 mL/min/{1.73_m2} — ABNORMAL LOW (ref >90)
GLUCOSE: 86 mg/dL (ref 70–140)
Potassium: 4.2 mEq/L (ref 3.5–5.1)
Sodium: 142 mEq/L (ref 136–145)
Total Bilirubin: 0.51 mg/dL (ref 0.20–1.20)
Total Protein: 7.9 g/dL (ref 6.4–8.3)

## 2015-07-22 LAB — CHCC SMEAR

## 2015-07-22 LAB — TECHNOLOGIST REVIEW

## 2015-07-22 MED ORDER — MULTI-VITAMIN/MINERALS PO TABS: 1.0000 | ORAL_TABLET | Freq: Every day | ORAL | Status: DC

## 2015-07-22 MED ORDER — CALCIUM CITRATE-VITAMIN D 250-100 MG-UNIT PO TABS: 1.0000 | ORAL_TABLET | Freq: Two times a day (BID) | ORAL | Status: DC

## 2015-07-22 MED ORDER — B COMPLEX VITAMINS PO CAPS: 1.0000 | ORAL_CAPSULE | Freq: Every day | ORAL | Status: DC

## 2015-07-22 NOTE — Telephone Encounter (Signed)
Gave adn prnted appt sched and avs for pt for Feb 2017 °

## 2015-07-22 NOTE — Progress Notes (Signed)
[facility]    HEMATOLOGY/ONCOLOGY CONSULTATION NOTE  Date of Service: 07/22/2015  Patient Care Team: Rowe Clack, MD as PCP - General  CHIEF COMPLAINTS/PURPOSE OF CONSULTATION:  Leucopenia  HISTORY OF PRESENTING ILLNESS:  Tara Jimenez is a wonderful 74 y.o. female who has been referred to [place] by Dr .Gwendolyn Grant, MD for evaluation and management of leukopenia.   patient has a history of hypertension, GERD, arthritis , UTIs ,  Previous history of a subdural hematoma in 2011 who has been referred to [place] for leukopenia.  Patient has had mild leukopenia In the mid 3000  Range since at least 2009.  Recently was noted to have  WBC count of 2.5k (diff on available) On 06/24/2015 which was lower than previous baseline and therefore triggered a hematology consultation.   Patient notes that other than a few episodes of urinary tract infection  she has not had an increased tendency for frequent infections.    She notes she has had some complains of a squeezing feeling in her hands and legs for a few weeks. Also notes some joint pains which she attributes to her arthritis.  Denies starting any new medications recently. No fevers or chills. No unexplained weight loss. No night sweats.  No obvious recent viral infections.   Her labs today show that her WBC counts have improved to 2.9k  Within Trainer of 1500. Patient has a normal hemoglobin of 12 and a normal platelet count of 194k.   No other specific focal symptoms.  MEDICAL HISTORY:  Past Medical History  Diagnosis Date  . Arthritis   . Glaucoma   . Patient Active Problem List   Diagnosis Date Noted  . Leukopenia 07/22/2015  . Paresthesias 07/22/2015  . Bone pain 07/22/2015  . Microscopic hematuria 02/06/2015  . Subdural hematoma ([place]) 02/19/2010  . OBESITY 02/15/2010  . PNEUMONIA 02/15/2010  . GERD 02/15/2010  . ARTHRITIS 02/15/2010  . ANKLE INJURY, RIGHT 02/15/2010  . UTI'S, HX OF 02/15/2010    SURGICAL HISTORY: Past Surgical  History  Procedure Laterality Date  . Brain surgery      SOCIAL HISTORY: Social History   Social History  . Marital Status: Single    Spouse Name: N/A  . Number of Children: N/A  . Years of Education: N/A   Occupational History  . Not on file.   Social History Main Topics  . Smoking status: Never Smoker   . Smokeless tobacco: Never Used  . Alcohol Use: No  . Drug Use: No  . Sexual Activity: Not on file   Other Topics Concern  . Not on file   Social History Narrative   No issues with ETOH use.  FAMILY HISTORY: Family History  Problem Relation Age of Onset  . Diabetes Brother   . Heart disease Brother   . Hypertension Brother   . Aneurysm Brother     ALLERGIES:  has No Known Allergies.  MEDICATIONS:  Current Outpatient Prescriptions  Medication Sig Dispense Refill  . aspirin EC 81 MG tablet Take 81 mg by mouth daily.    [facility] Kitchen latanoprost (XALATAN) 0.005 % ophthalmic solution Place 1 drop into both eyes at bedtime.      No current facility-administered medications for this visit.  Advil as needed for arthritis  REVIEW OF SYSTEMS:    10 Point review of Systems was done is negative except as noted above.  PHYSICAL EXAMINATION: ECOG PERFORMANCE STATUS: 1 - Symptomatic but completely ambulatory  . Filed Vitals:   07/22/15  0951  BP: 150/64  Pulse: 81  Temp: 98.6 F (37 C)  Resp: 20   Filed Weights   07/22/15 0951  Weight: 189 lb 1.6 oz (85.775 kg)   .Body mass index is 30.28 kg/(m^2).  GENERAL:alert, in no acute distress and comfortable SKIN: skin color, texture, turgor are normal, no rashes or significant lesions EYES: normal, conjunctiva are pink and non-injected, sclera clear OROPHARYNX:no exudate, no erythema and lips, buccal mucosa, and tongue normal  NECK: supple, no JVD, thyroid normal size, non-tender, without nodularity LYMPH:  no palpable lymphadenopathy in the cervical, axillary or inguinal LUNGS: clear to auscultation with normal  respiratory effort HEART: regular rate & rhythm,  no murmurs and no lower extremity edema ABDOMEN: abdomen soft, non-tender, normoactive bowel sounds  Musculoskeletal: no cyanosis of digits and no clubbing  PSYCH: alert & oriented x 3 with fluent speech NEURO: no focal motor/sensory deficits  LABORATORY DATA:  I have reviewed the data as listed  . CBC Latest Ref Rng 07/22/2015 06/24/2015 11/10/2014  WBC 3.9 - 10.3 10e3/uL 2.9(L) 2.5(L) 3.0(L)  Hemoglobin 11.6 - 15.9 g/dL 12.0 11.8(L) 12.0  Hematocrit 34.8 - 46.6 % 37.9 37.1 37.8  Platelets 145 - 400 10e3/uL 194 192 215   ANC 1.5 . CMP Latest Ref Rng 07/22/2015 06/24/2015 09/10/2014  Glucose 70 - 140 mg/dl 86 72 90  BUN 7.0 - 26.0 mg/dL 12.2 12 12   Creatinine 0.6 - 1.1 mg/dL 0.9 0.79 0.77  Sodium 136 - 145 mEq/L 142 140 139  Potassium 3.5 - 5.1 mEq/L 4.2 4.0 4.5  Chloride 98 - 110 mmol/L - 107 105  CO2 22 - 29 mEq/L 23 24 27   Calcium 8.4 - 10.4 mg/dL 9.2 8.8 9.2  Total Protein 6.4 - 8.3 g/dL 7.9 7.1 7.5  Total Bilirubin 0.20 - 1.20 mg/dL 0.51 0.5 0.4  Alkaline Phos 40 - 150 U/L 54 53 56  AST 5 - 34 U/L 18 15 15   ALT 0 - 55 U/L 16 13 12      . Lab Results  Component Value Date   TOTALPROTELP 7.0 07/22/2015   ALBUMINELP 3.7* 07/22/2015   A1GS 0.3 07/22/2015   A2GS 1.0* 07/22/2015   BETS 0.4 07/22/2015   BETA2SER 0.4 07/22/2015   GAMS 1.2 07/22/2015   SPEI * 07/22/2015  Immunofix Electr Int  *   Comments: Monoclonal IgA kappa protein is present.         SPE Interp.  *   Comments: A restricted band consistent with monoclonal protein is present.  The monoclonal protein peak accounts for 0.3 g/dL of the total  1.0 g/dL of protein in the ALPHA-2 region.  Reviewed by Odis Hollingshead, MD, PhD, FCAP        Peripheral Blood Smear (reviewed by me) -normocytic red cells, no increased schistocytes, adequate platelets, wbc with normal appearing maturation, some pelgeroid neutrophils. Increased LGL's.  RADIOGRAPHIC  STUDIES: I have personally reviewed the radiological images as listed and agreed with the findings in the report. No results found.  ASSESSMENT & PLAN:   74 yo AAF with   1) Chronic leucopenia with some recent worsening. Now seems to be improving again with WBC 2.9k up from 2.5k with normal ANC of 1500 with no clear association with increased infections. Likely some baseline leucopenia (?Benign ethnic neutropenia - patient has AA ethnicity) Peripheral blood smear shows increased large granular lymphocytes that could be reactive to a viral infection or might suggestive mild LGL leukemia (that at this time would  not need treatment)  2) Bone pains and paresthesias ? Neuropathy. 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 -will get flow cytometry and TCR gene rearrangement studies due to concern for possible LGL leukemia -rpt SPEP, K/L SFLC,  -skeletal survey XRay -no indication for G-CSF at this time. -continue f/u with PCP for management of other medical issues.   All of the patients questions were answered with apparent satisfaction. The patient knows to call the clinic with any problems, questions or concerns.  I spent 45 minutes counseling the patient face to face. The total time spent in the appointment was 60 minutes and more than 50% was on counseling and direct patient cares.    Sullivan Lone MD [place] AAHIVMS [facility] Hematology/Oncology Physician [facility]  (Office):       [phone] (Work cell):  [phone] (Fax):           [phone]  07/22/2015 10:12 AM

## 2015-07-24 LAB — SPEP & IFE WITH QIG
ALPHA-1-GLOBULIN: 0.3 g/dL (ref 0.2–0.3)
Abnormal Protein Band1: 0.3 g/dL
Albumin ELP: 3.7 g/dL — ABNORMAL LOW (ref 3.8–4.8)
Alpha-2-Globulin: 1 g/dL — ABNORMAL HIGH (ref 0.5–0.9)
Beta 2: 0.4 g/dL (ref 0.2–0.5)
Beta Globulin: 0.4 g/dL (ref 0.4–0.6)
Gamma Globulin: 1.2 g/dL (ref 0.8–1.7)
IGM, SERUM: 27 mg/dL — AB (ref 52–322)
IgA: 556 mg/dL — ABNORMAL HIGH (ref 69–380)
IgG (Immunoglobin G), Serum: 1360 mg/dL (ref 690–1700)
Total Protein, Serum Electrophoresis: 7 g/dL (ref 6.1–8.1)

## 2015-07-24 LAB — VITAMIN D 25 HYDROXY (VIT D DEFICIENCY, FRACTURES): Vit D, 25-Hydroxy: 14 ng/mL — ABNORMAL LOW (ref 30–100)

## 2015-07-24 LAB — VITAMIN B12: Vitamin B-12: 452 pg/mL (ref 211–911)

## 2015-07-24 LAB — COPPER, SERUM: COPPER: 119 ug/dL (ref 70–175)

## 2015-08-17 DIAGNOSIS — D472 Monoclonal gammopathy: Secondary | ICD-10-CM | POA: Insufficient documentation

## 2015-09-15 ENCOUNTER — Ambulatory Visit (HOSPITAL_COMMUNITY)
Admission: RE | Admit: 2015-09-15 | Discharge: 2015-09-15 | Disposition: A | Discharge status: Home / Self Care | Payer: Medicare Other | Source: Ambulatory Visit | Attending: Hematology | Admitting: Hematology

## 2015-09-15 DIAGNOSIS — D472 Monoclonal gammopathy: Secondary | ICD-10-CM | POA: Diagnosis present

## 2015-09-16 ENCOUNTER — Encounter: Payer: Self-pay | Admitting: Hematology

## 2015-09-16 ENCOUNTER — Other Ambulatory Visit (HOSPITAL_COMMUNITY)
Admission: RE | Admit: 2015-09-16 | Discharge: 2015-09-16 | Disposition: A | Discharge status: Home / Self Care | Payer: Medicare Other | Source: Ambulatory Visit | Attending: Hematology | Admitting: Hematology

## 2015-09-16 ENCOUNTER — Telehealth: Payer: Self-pay | Admitting: Hematology

## 2015-09-16 ENCOUNTER — Ambulatory Visit (HOSPITAL_COMMUNITY): Payer: Medicare Other

## 2015-09-16 ENCOUNTER — Ambulatory Visit (HOSPITAL_BASED_OUTPATIENT_CLINIC_OR_DEPARTMENT_OTHER): Payer: Medicare Other | Admitting: Hematology

## 2015-09-16 ENCOUNTER — Other Ambulatory Visit (HOSPITAL_BASED_OUTPATIENT_CLINIC_OR_DEPARTMENT_OTHER): Payer: Medicare Other

## 2015-09-16 VITALS — BP 158/59 | HR 76 | Temp 98.6°F | Resp 18 | Ht 66.25 in | Wt 191.0 lb

## 2015-09-16 DIAGNOSIS — D72819 Decreased white blood cell count, unspecified: Secondary | ICD-10-CM

## 2015-09-16 DIAGNOSIS — D472 Monoclonal gammopathy: Secondary | ICD-10-CM

## 2015-09-16 LAB — CBC & DIFF AND RETIC
BASO%: 0.3 % (ref 0.0–2.0)
Basophils Absolute: 0 10*3/uL (ref 0.0–0.1)
EOS ABS: 0.1 10*3/uL (ref 0.0–0.5)
EOS%: 2.3 % (ref 0.0–7.0)
HCT: 36.4 % (ref 34.8–46.6)
HEMOGLOBIN: 11.8 g/dL (ref 11.6–15.9)
Immature Retic Fract: 2.6 % (ref 1.60–10.00)
LYMPH%: 48.1 % (ref 14.0–49.7)
MCH: 29.6 pg (ref 25.1–34.0)
MCHC: 32.4 g/dL (ref 31.5–36.0)
MCV: 91.5 fL (ref 79.5–101.0)
MONO#: 0.2 10*3/uL (ref 0.1–0.9)
MONO%: 7.1 % (ref 0.0–14.0)
NEUT%: 42.2 % (ref 38.4–76.8)
NEUTROS ABS: 1.3 10*3/uL — AB (ref 1.5–6.5)
Platelets: 187 10*3/uL (ref 145–400)
RBC: 3.98 10*6/uL (ref 3.70–5.45)
RDW: 13.2 % (ref 11.2–14.5)
RETIC %: 1.4 % (ref 0.70–2.10)
Retic Ct Abs: 55.72 10*3/uL (ref 33.70–90.70)
WBC: 3.1 10*3/uL — AB (ref 3.9–10.3)
lymph#: 1.5 10*3/uL (ref 0.9–3.3)

## 2015-09-16 LAB — COMPREHENSIVE METABOLIC PANEL
ALBUMIN: 3.5 g/dL (ref 3.5–5.0)
ALK PHOS: 47 U/L (ref 40–150)
ALT: 17 U/L (ref 0–55)
AST: 18 U/L (ref 5–34)
Anion Gap: 9 mEq/L (ref 3–11)
BUN: 12.5 mg/dL (ref 7.0–26.0)
CHLORIDE: 109 meq/L (ref 98–109)
CO2: 25 meq/L (ref 22–29)
Calcium: 9.1 mg/dL (ref 8.4–10.4)
Creatinine: 0.9 mg/dL (ref 0.6–1.1)
EGFR: 75 mL/min/{1.73_m2} — AB (ref >90)
GLUCOSE: 89 mg/dL (ref 70–140)
POTASSIUM: 4.4 meq/L (ref 3.5–5.1)
SODIUM: 143 meq/L (ref 136–145)
Total Bilirubin: 0.47 mg/dL (ref 0.20–1.20)
Total Protein: 7.3 g/dL (ref 6.4–8.3)

## 2015-09-16 NOTE — Telephone Encounter (Signed)
per pof to sch pt appt-cld pt and adv-pt req to mail sch-mailed

## 2015-09-17 LAB — KAPPA/LAMBDA LIGHT CHAINS
Ig Kappa Free Light Chain: 25.39 mg/L — ABNORMAL HIGH (ref 3.30–19.40)
Ig Lambda Free Light Chain: 19 mg/L (ref 5.71–26.30)
KAPPA/LAMBDA FLC RATIO: 1.34 (ref 0.26–1.65)

## 2015-09-21 LAB — FLOW CYTOMETRY

## 2015-10-02 ENCOUNTER — Other Ambulatory Visit: Payer: Self-pay | Admitting: Physician Assistant

## 2015-10-02 MED ORDER — OSELTAMIVIR PHOSPHATE 75 MG PO CAPS: 75.0000 mg | ORAL_CAPSULE | Freq: Every day | ORAL | Status: AC

## 2015-10-02 NOTE — Progress Notes (Signed)
Requests tamiflu.

## 2015-10-13 NOTE — Progress Notes (Signed)
Tara Jimenez    HEMATOLOGY/ONCOLOGY CONSULTATION NOTE  Date of Service: 10/13/2015  Patient Care Team: Rowe Clack, MD as PCP - General  CHIEF COMPLAINTS/PURPOSE OF CONSULTATION:  Leucopenia  HISTORY OF PRESENTING ILLNESS: please see my initial consultation for details of her initial presentation  Interval history  Tara Jimenez is here for a followup for her leukopenia.  Notes no issues with new infections. Feels well.  No significant clinical change in since her last visit. WBC count is improved from 2.9 to 3.1k.  ANC was 1500 and is now down to 1300.   MEDICAL HISTORY:  Past Medical History  Diagnosis Date  . Arthritis   . Glaucoma   . Patient Active Problem List   Diagnosis Date Noted  . MGUS (monoclonal gammopathy of unknown significance) 08/17/2015  . Leukopenia 07/22/2015  . Paresthesias 07/22/2015  . Bone pain 07/22/2015  . Microscopic hematuria 02/06/2015  . Subdural hematoma ([place]) 02/19/2010  . OBESITY 02/15/2010  . PNEUMONIA 02/15/2010  . GERD 02/15/2010  . ARTHRITIS 02/15/2010  . ANKLE INJURY, RIGHT 02/15/2010  . UTI'S, HX OF 02/15/2010    SURGICAL HISTORY: Past Surgical History  Procedure Laterality Date  . Brain surgery      SOCIAL HISTORY: Social History   Social History  . Marital Status: Single    Spouse Name: N/A  . Number of Children: N/A  . Years of Education: N/A   Occupational History  . Not on file.   Social History Main Topics  . Smoking status: Never Smoker   . Smokeless tobacco: Never Used  . Alcohol Use: No  . Drug Use: No  . Sexual Activity: Not on file   Other Topics Concern  . Not on file   Social History Narrative   No issues with ETOH use.  FAMILY HISTORY: Family History  Problem Relation Age of Onset  . Diabetes Brother   . Heart disease Brother   . Hypertension Brother   . Aneurysm Brother     ALLERGIES:  has No Known Allergies.  MEDICATIONS:  Current Outpatient Prescriptions  Medication Sig Dispense  Refill  . aspirin EC 81 MG tablet Take 81 mg by mouth daily.    Tara Jimenez b complex vitamins capsule Take 1 capsule by mouth daily. 60 capsule 3  . calcium-vitamin D 250-100 MG-UNIT tablet Take 1 tablet by mouth 2 (two) times daily. 60 tablet 3  . latanoprost (XALATAN) 0.005 % ophthalmic solution Place 1 drop into both eyes at bedtime.     . Multiple Vitamins-Minerals (MULTIVITAMIN WITH MINERALS) tablet Take 1 tablet by mouth daily. 30 tablet 3   No current facility-administered medications for this visit.  Advil as needed for arthritis  REVIEW OF SYSTEMS:    10 Point review of Systems was done is negative except as noted above.  PHYSICAL EXAMINATION: ECOG PERFORMANCE STATUS: 1 - Symptomatic but completely ambulatory  . Filed Vitals:   09/16/15 0946  BP: 158/59  Pulse: 76  Temp: 98.6 F (37 C)  Resp: 18   Filed Weights   09/16/15 0946  Weight: 191 lb (86.637 kg)   .Body mass index is 30.59 kg/(m^2).  GENERAL:alert, in no acute distress and comfortable SKIN: skin color, texture, turgor are normal, no rashes or significant lesions EYES: normal, conjunctiva are pink and non-injected, sclera clear OROPHARYNX:no exudate, no erythema and lips, buccal mucosa, and tongue normal  NECK: supple, no JVD, thyroid normal size, non-tender, without nodularity LYMPH:  no palpable lymphadenopathy in the cervical, axillary or inguinal  LUNGS: clear to auscultation with normal respiratory effort HEART: regular rate & rhythm,  no murmurs and no lower extremity edema ABDOMEN: abdomen soft, non-tender, normoactive bowel sounds  Musculoskeletal: no cyanosis of digits and no clubbing  PSYCH: alert & oriented x 3 with fluent speech NEURO: no focal motor/sensory deficits  LABORATORY DATA:  I have reviewed the data as listed  . CBC Latest Ref Rng 09/16/2015 07/22/2015 06/24/2015  WBC 3.9 - 10.3 10e3/uL 3.1(L) 2.9(L) 2.5(L)  Hemoglobin 11.6 - 15.9 g/dL 11.8 12.0 11.8(L)  Hematocrit 34.8 - 46.6 % 36.4  37.9 37.1  Platelets 145 - 400 10e3/uL 187 194 192   ANC 1.5 . CMP Latest Ref Rng 09/16/2015 07/22/2015 06/24/2015  Glucose 70 - 140 mg/dl 89 86 72  BUN 7.0 - 26.0 mg/dL 12.5 12.2 12  Creatinine 0.6 - 1.1 mg/dL 0.9 0.9 0.79  Sodium 136 - 145 mEq/L 143 142 140  Potassium 3.5 - 5.1 mEq/L 4.4 4.2 4.0  Chloride 98 - 110 mmol/L - - 107  CO2 22 - 29 mEq/L 25 23 24   Calcium 8.4 - 10.4 mg/dL 9.1 9.2 8.8  Total Protein 6.4 - 8.3 g/dL 7.3 7.9 7.1  Total Bilirubin 0.20 - 1.20 mg/dL 0.47 0.51 0.5  Alkaline Phos 40 - 150 U/L 47 54 53  AST 5 - 34 U/L 18 18 15   ALT 0 - 55 U/L 17 16 13      . Lab Results  Component Value Date   TOTALPROTELP 7.0 07/22/2015   ALBUMINELP 3.7* 07/22/2015   A1GS 0.3 07/22/2015   A2GS 1.0* 07/22/2015   BETS 0.4 07/22/2015   BETA2SER 0.4 07/22/2015   GAMS 1.2 07/22/2015   SPEI * 07/22/2015                                                   SPE Interp.  *   Comments: A restricted band consistent with monoclonal protein is present.  The monoclonal protein peak accounts for 0.3 g/dL of the total  1.0 g/dL of protein in the ALPHA-2 region.  Reviewed by Odis Hollingshead, MD, PhD, FCAP        Peripheral Blood Smear (reviewed by me) -normocytic red cells, no increased schistocytes, adequate platelets, wbc with normal appearing maturation, some pelgeroid neutrophils. Increased LGL's.       RADIOGRAPHIC STUDIES: I have personally reviewed the radiological images as listed and agreed with the findings in the report. Dg Bone Survey Met  09/15/2015  CLINICAL DATA:  Monoclonal gammopathy. EXAM: METASTATIC BONE SURVEY COMPARISON:  None in PACs FINDINGS: Skull:  No lytic or blastic skull lesion is observed. Spine: The vertebral bodies are preserved in height. There is mild degenerative disc space narrowing at L4-5. No lytic or blastic lesion is observed. Chest: The lungs are well-expanded and clear. The heart and pulmonary vascularity are normal. There is  mild tortuosity of the descending thoracic aorta. The clavicles appear intact. The ribs exhibit no lytic or blastic lesion. Upper extremities: Mild degenerative changes of the AC joints and right glenohumeral joint. No lytic or blastic bony lesions are demonstrated. Pelvis and lower extremities: The bony pelvis appears adequately mineralized. The hip joint spaces are reasonably well-maintained. No lytic or blastic lesions of the long bones of the lower extremities are observed. IMPRESSION: No skeletal lesions attributable to multiple myeloma are observed. There  are mild degenerative changes centered in the lower lumbar spine and shoulders. Electronically Signed   By: David  Martinique M.D.   On: 09/15/2015 08:27    ASSESSMENT & PLAN:   75 yo AAF with   1) Chronic leucopenia w. Relatively stable since last visit WBC count 3.1k with ANC 1.3k. No infections. Previously with WBC 2.9k up from 2.5k with normal ANC of 1500 with no clear association with increased infections. Likely some baseline leucopenia (?Benign ethnic neutropenia - patient has AA ethnicity) Peripheral blood smear shows increased large granular lymphocytes that could be reactive to a viral infection or might suggestive mild LGL leukemia. Peripheral blood flow cytometry did not show any overt monoclonal B or T-cell population. However T-cell receptor gene rearrangement studies shows a positive gene rearrangement forT cell cells suggesting the possibility of T-LGL Plan - no indication for treatment of the patients suspected T-LGL given relatively stable blood counts -might need to consider treating if ANC <1000 especially with frequent infections. -no indication for G-CSF at this time.  2) Bone pains and paresthesias ? Neuropathy. SPEP - showed IgA kappa MGUS with M spike on 0.3g/dl Bone survey with no skeletal lesions attributable to Multiple myeloma. Plan -rpt SPEP in 6 months  RTC with Dr Irene Limbo in 6 months with cbc, cmp.  SPEP  -continue f/u with PCP for management of other medical issues.   All of the patients questions were answered with apparent satisfaction. The patient knows to call the clinic with any problems, questions or concerns.  I spent 25 minutes counseling the patient face to face. The total time spent in the appointment was 25 minutes and more than 50% was on counseling and direct patient cares.    Sullivan Lone MD [place] AAHIVMS [facility] Hematology/Oncology Physician [facility]  (Office):       [phone] (Work cell):  [phone] (Fax):           [phone]

## 2015-11-27 ENCOUNTER — Other Ambulatory Visit: Payer: Self-pay

## 2015-11-27 DIAGNOSIS — Z1231 Encounter for screening mammogram for malignant neoplasm of breast: Secondary | ICD-10-CM

## 2015-12-15 DIAGNOSIS — H401122 Primary open-angle glaucoma, left eye, moderate stage: Secondary | ICD-10-CM | POA: Diagnosis not present

## 2015-12-15 DIAGNOSIS — H401111 Primary open-angle glaucoma, right eye, mild stage: Secondary | ICD-10-CM | POA: Diagnosis not present

## 2016-01-06 ENCOUNTER — Ambulatory Visit
Admission: RE | Admit: 2016-01-06 | Discharge: 2016-01-06 | Disposition: A | Discharge status: Home / Self Care | Payer: Medicare Other | Source: Ambulatory Visit

## 2016-01-06 DIAGNOSIS — Z1231 Encounter for screening mammogram for malignant neoplasm of breast: Secondary | ICD-10-CM | POA: Diagnosis not present

## 2016-03-15 ENCOUNTER — Encounter: Payer: Self-pay | Admitting: Hematology

## 2016-03-15 ENCOUNTER — Ambulatory Visit (HOSPITAL_BASED_OUTPATIENT_CLINIC_OR_DEPARTMENT_OTHER): Payer: Medicare Other | Admitting: Hematology

## 2016-03-15 ENCOUNTER — Other Ambulatory Visit (HOSPITAL_BASED_OUTPATIENT_CLINIC_OR_DEPARTMENT_OTHER): Payer: Medicare Other

## 2016-03-15 ENCOUNTER — Telehealth: Payer: Self-pay | Admitting: Hematology

## 2016-03-15 VITALS — BP 148/67 | HR 73 | Temp 98.3°F | Resp 18 | Ht 66.25 in | Wt 186.8 lb

## 2016-03-15 DIAGNOSIS — D72820 Lymphocytosis (symptomatic): Secondary | ICD-10-CM

## 2016-03-15 DIAGNOSIS — D472 Monoclonal gammopathy: Secondary | ICD-10-CM

## 2016-03-15 DIAGNOSIS — D72819 Decreased white blood cell count, unspecified: Secondary | ICD-10-CM

## 2016-03-15 LAB — CBC & DIFF AND RETIC
BASO%: 0.3 % (ref 0.0–2.0)
Basophils Absolute: 0 10*3/uL (ref 0.0–0.1)
EOS%: 2.5 % (ref 0.0–7.0)
Eosinophils Absolute: 0.1 10*3/uL (ref 0.0–0.5)
HCT: 37.4 % (ref 34.8–46.6)
HGB: 12.1 g/dL (ref 11.6–15.9)
IMMATURE RETIC FRACT: 2.7 % (ref 1.60–10.00)
LYMPH#: 1.5 10*3/uL (ref 0.9–3.3)
LYMPH%: 46.4 % (ref 14.0–49.7)
MCH: 29.5 pg (ref 25.1–34.0)
MCHC: 32.4 g/dL (ref 31.5–36.0)
MCV: 91.2 fL (ref 79.5–101.0)
MONO#: 0.2 10*3/uL (ref 0.1–0.9)
MONO%: 6.5 % (ref 0.0–14.0)
NEUT%: 44.3 % (ref 38.4–76.8)
NEUTROS ABS: 1.4 10*3/uL — AB (ref 1.5–6.5)
Platelets: 197 10*3/uL (ref 145–400)
RBC: 4.1 10*6/uL (ref 3.70–5.45)
RDW: 13 % (ref 11.2–14.5)
RETIC CT ABS: 46.74 10*3/uL (ref 33.70–90.70)
Retic %: 1.14 % (ref 0.70–2.10)
WBC: 3.2 10*3/uL — AB (ref 3.9–10.3)

## 2016-03-15 LAB — COMPREHENSIVE METABOLIC PANEL
ALT: 19 U/L (ref 0–55)
ANION GAP: 8 meq/L (ref 3–11)
AST: 20 U/L (ref 5–34)
Albumin: 3.6 g/dL (ref 3.5–5.0)
Alkaline Phosphatase: 48 U/L (ref 40–150)
BILIRUBIN TOTAL: 0.47 mg/dL (ref 0.20–1.20)
BUN: 13.8 mg/dL (ref 7.0–26.0)
CHLORIDE: 109 meq/L (ref 98–109)
CO2: 26 meq/L (ref 22–29)
CREATININE: 0.8 mg/dL (ref 0.6–1.1)
Calcium: 9.4 mg/dL (ref 8.4–10.4)
EGFR: 79 mL/min/{1.73_m2} — AB (ref >90)
GLUCOSE: 91 mg/dL (ref 70–140)
Potassium: 4.4 mEq/L (ref 3.5–5.1)
SODIUM: 143 meq/L (ref 136–145)
TOTAL PROTEIN: 7.7 g/dL (ref 6.4–8.3)

## 2016-03-15 NOTE — Telephone Encounter (Signed)
Gave patient avs report and appointments for February.  °

## 2016-03-16 LAB — MULTIPLE MYELOMA PANEL, SERUM
ALBUMIN/GLOB SERPL: 1.1 (ref 0.7–1.7)
ALPHA 1: 0.2 g/dL (ref 0.0–0.4)
ALPHA2 GLOB SERPL ELPH-MCNC: 0.8 g/dL (ref 0.4–1.0)
Albumin SerPl Elph-Mcnc: 3.6 g/dL (ref 2.9–4.4)
B-GLOBULIN SERPL ELPH-MCNC: 1.2 g/dL (ref 0.7–1.3)
GAMMA GLOB SERPL ELPH-MCNC: 1.2 g/dL (ref 0.4–1.8)
GLOBULIN, TOTAL: 3.4 g/dL (ref 2.2–3.9)
IgA, Qn, Serum: 476 mg/dL — ABNORMAL HIGH (ref 64–422)
IgM, Qn, Serum: 27 mg/dL (ref 26–217)
Total Protein: 7 g/dL (ref 6.0–8.5)

## 2016-03-16 LAB — KAPPA/LAMBDA LIGHT CHAINS
Ig Kappa Free Light Chain: 22.6 mg/L — ABNORMAL HIGH (ref 3.3–19.4)
Ig Lambda Free Light Chain: 18.7 mg/L (ref 5.7–26.3)
KAPPA/LAMBDA FLC RATIO: 1.21 (ref 0.26–1.65)

## 2016-03-19 NOTE — Progress Notes (Signed)
Marland Kitchen    HEMATOLOGY/ONCOLOGY CONSULTATION NOTE  Date of Service: 03/19/2016  Patient Care Team: Rowe Clack, MD as PCP - General  CHIEF COMPLAINTS/PURPOSE OF CONSULTATION:  Leucopenia  HISTORY OF PRESENTING ILLNESS: please see my initial consultation for details of her initial presentation  Interval history  Tara Jimenez is here for a followup for her leukopenia/neutropenia.  Notes no issues with new infections. Feels well. No acute new symptoms. No abnormal weight loss/night sweats/fevers/chills. ANC is stable. No new bone pains.   MEDICAL HISTORY:  Past Medical History:  Diagnosis Date  . Arthritis   . Glaucoma   . Patient Active Problem List   Diagnosis Date Noted  . MGUS (monoclonal gammopathy of unknown significance) 08/17/2015  . Leukopenia 07/22/2015  . Paresthesias 07/22/2015  . Bone pain 07/22/2015  . Microscopic hematuria 02/06/2015  . Subdural hematoma ([place]) 02/19/2010  . OBESITY 02/15/2010  . PNEUMONIA 02/15/2010  . GERD 02/15/2010  . ARTHRITIS 02/15/2010  . ANKLE INJURY, RIGHT 02/15/2010  . UTI'S, HX OF 02/15/2010    SURGICAL HISTORY: Past Surgical History:  Procedure Laterality Date  . BRAIN SURGERY      SOCIAL HISTORY: Social History   Social History  . Marital status: Single    Spouse name: N/A  . Number of children: N/A  . Years of education: N/A   Occupational History  . Not on file.   Social History Main Topics  . Smoking status: Never Smoker  . Smokeless tobacco: Never Used  . Alcohol use No  . Drug use: No  . Sexual activity: Not on file   Other Topics Concern  . Not on file   Social History Narrative  . No narrative on file   No issues with ETOH use.  FAMILY HISTORY: Family History  Problem Relation Age of Onset  . Diabetes Brother   . Heart disease Brother   . Hypertension Brother   . Aneurysm Brother     ALLERGIES:  has No Known Allergies.  MEDICATIONS:  Current Outpatient Prescriptions  Medication Sig  Dispense Refill  . aspirin EC 81 MG tablet Take 81 mg by mouth daily.    Marland Kitchen b complex vitamins capsule Take 1 capsule by mouth daily. 60 capsule 3  . calcium-vitamin D 250-100 MG-UNIT tablet Take 1 tablet by mouth 2 (two) times daily. 60 tablet 3  . latanoprost (XALATAN) 0.005 % ophthalmic solution Place 1 drop into both eyes at bedtime.     . Multiple Vitamins-Minerals (MULTIVITAMIN WITH MINERALS) tablet Take 1 tablet by mouth daily. 30 tablet 3   No current facility-administered medications for this visit.   Advil as needed for arthritis  REVIEW OF SYSTEMS:    10 Point review of Systems was done is negative except as noted above.  PHYSICAL EXAMINATION: ECOG PERFORMANCE STATUS: 1 - Symptomatic but completely ambulatory  . Vitals:   03/15/16 0941 03/15/16 0944  BP: (!) 157/53 (!) 148/67  Pulse: 73   Resp: 18   Temp: 98.3 F (36.8 C)    Filed Weights   03/15/16 0941  Weight: 186 lb 12.8 oz (84.7 kg)   .Body mass index is 29.92 kg/m.  GENERAL:alert, in no acute distress and comfortable SKIN: skin color, texture, turgor are normal, no rashes or significant lesions EYES: normal, conjunctiva are pink and non-injected, sclera clear OROPHARYNX:no exudate, no erythema and lips, buccal mucosa, and tongue normal  NECK: supple, no JVD, thyroid normal size, non-tender, without nodularity LYMPH:  no palpable lymphadenopathy in the cervical,  axillary or inguinal LUNGS: clear to auscultation with normal respiratory effort HEART: regular rate & rhythm,  no murmurs and no lower extremity edema ABDOMEN: abdomen soft, non-tender, normoactive bowel sounds  Musculoskeletal: no cyanosis of digits and no clubbing  PSYCH: alert & oriented x 3 with fluent speech NEURO: no focal motor/sensory deficits  LABORATORY DATA:  I have reviewed the data as listed  . CBC Latest Ref Rng & Units 03/15/2016 09/16/2015 07/22/2015  WBC 3.9 - 10.3 10e3/uL 3.2(L) 3.1(L) 2.9(L)  Hemoglobin 11.6 - 15.9 g/dL 12.1  11.8 12.0  Hematocrit 34.8 - 46.6 % 37.4 36.4 37.9  Platelets 145 - 400 10e3/uL 197 187 194   ANC 1.5 . CMP Latest Ref Rng & Units 03/15/2016 03/15/2016 09/16/2015  Glucose 70 - 140 mg/dl 91 - 89  BUN 7.0 - 26.0 mg/dL 13.8 - 12.5  Creatinine 0.6 - 1.1 mg/dL 0.8 - 0.9  Sodium 136 - 145 mEq/L 143 - 143  Potassium 3.5 - 5.1 mEq/L 4.4 - 4.4  Chloride 98 - 110 mmol/L - - -  CO2 22 - 29 mEq/L 26 - 25  Calcium 8.4 - 10.4 mg/dL 9.4 - 9.1  Total Protein 6.0 - 8.5 g/dL 7.7 7.0 7.3  Total Bilirubin 0.20 - 1.20 mg/dL 0.47 - 0.47  Alkaline Phos 40 - 150 U/L 48 - 47  AST 5 - 34 U/L 20 - 18  ALT 0 - 55 U/L 19 - 17   Component     Latest Ref Rng & Units 03/15/2016  IgG (Immunoglobin G), Serum     700 - 1600 mg/dL 1,349  IgA/Immunoglobulin A, Serum     64 - 422 mg/dL 476 (H)  IgM, Qn, Serum     26 - 217 mg/dL 27  Total Protein     6.0 - 8.5 g/dL 7.0  Albumin SerPl Elph-Mcnc     2.9 - 4.4 g/dL 3.6  Alpha 1     0.0 - 0.4 g/dL 0.2  Alpha2 Glob SerPl Elph-Mcnc     0.4 - 1.0 g/dL 0.8  B-Globulin SerPl Elph-Mcnc     0.7 - 1.3 g/dL 1.2  Gamma Glob SerPl Elph-Mcnc     0.4 - 1.8 g/dL 1.2  M Protein SerPl Elph-Mcnc     Not Observed g/dL Not Observed  Globulin, Total     2.2 - 3.9 g/dL 3.4  Albumin/Glob SerPl     0.7 - 1.7 1.1  IFE 1      Comment  Please Note (HCV):      Comment  Ig Kappa Free Light Chain     3.3 - 19.4 mg/L 22.6 (H)  Ig Lambda Free Light Chain     5.7 - 26.3 mg/L 18.7  Kappa/Lambda FluidC Ratio     0.26 - 1.65 1.21    Multiple Myeloma Panel (SPEP&IFE w/QIG)      No reference range information available      Comments:           Immunofixation shows IgA monoclonal protein with kappa light           chain           specificity.  RADIOGRAPHIC STUDIES: I have personally reviewed the radiological images as listed and agreed with the findings in the report. No results found.  ASSESSMENT & PLAN:   75 yo AAF with   1) Chronic leucopenia w. Relatively stable since last  visit WBC count 3.2k with ANC 1.4k. no clear association with increased infections.  Likely some baseline leucopenia (?Benign ethnic neutropenia - patient has AA ethnicity) Peripheral blood smear shows increased large granular lymphocytes that could be reactive to a viral infection or might suggestive mild LGL leukemia. Peripheral blood flow cytometry did not show any overt monoclonal B or T-cell population. However T-cell receptor gene rearrangement studies shows a positive gene rearrangement forT cell cells suggesting the possibility of T-LGL Plan - no indication for treatment of the patients suspected T-LGL given relatively stable blood counts -might need to consider treating if ANC <1000 especially with frequent infections. -no indication for G-CSF at this time. -no other associated cytopenias at this time.  2) Bone pains and paresthesias ? Neuropathy. SPEP - showed IgA kappa MGUS with M spike on 0.3g/dl Bone survey with no skeletal lesions attributable to Multiple myeloma. Rpt myeloma panel with no M spike but has IFE has on monoclonal IgA protein with kappa light chain specificity. K/L ration within normal limits Plan -rpt SPEP in 6 months  RTC with Dr Irene Limbo in 6 months with cbc, cmp. Myeloma panel  -continue f/u with PCP for management of other medical issues.   All of the patients questions were answered with apparent satisfaction. The patient knows to call the clinic with any problems, questions or concerns.  I spent 20 minutes counseling the patient face to face. The total time spent in the appointment was 20 minutes and more than 50% was on counseling and direct patient cares.    Sullivan Lone MD [place] AAHIVMS [facility] Hematology/Oncology Physician [facility]  (Office):       [phone] (Work cell):  [phone] (Fax):           [phone]

## 2016-04-08 ENCOUNTER — Other Ambulatory Visit: Payer: Self-pay

## 2016-06-16 DIAGNOSIS — H2513 Age-related nuclear cataract, bilateral: Secondary | ICD-10-CM | POA: Diagnosis not present

## 2016-06-16 DIAGNOSIS — H401122 Primary open-angle glaucoma, left eye, moderate stage: Secondary | ICD-10-CM | POA: Diagnosis not present

## 2016-06-16 DIAGNOSIS — H401111 Primary open-angle glaucoma, right eye, mild stage: Secondary | ICD-10-CM | POA: Diagnosis not present

## 2016-06-22 ENCOUNTER — Ambulatory Visit (INDEPENDENT_AMBULATORY_CARE_PROVIDER_SITE_OTHER): Payer: Medicare Other | Admitting: Podiatry

## 2016-06-22 ENCOUNTER — Ambulatory Visit (INDEPENDENT_AMBULATORY_CARE_PROVIDER_SITE_OTHER): Payer: Medicare Other

## 2016-06-22 VITALS — BP 141/73 | HR 73 | Temp 99.0°F | Resp 16 | Ht 67.0 in | Wt 185.0 lb

## 2016-06-22 DIAGNOSIS — M21371 Foot drop, right foot: Secondary | ICD-10-CM

## 2016-06-22 DIAGNOSIS — M21619 Bunion of unspecified foot: Secondary | ICD-10-CM | POA: Diagnosis not present

## 2016-06-22 DIAGNOSIS — M7750 Other enthesopathy of unspecified foot: Secondary | ICD-10-CM | POA: Diagnosis not present

## 2016-06-22 DIAGNOSIS — M779 Enthesopathy, unspecified: Secondary | ICD-10-CM | POA: Diagnosis not present

## 2016-06-22 DIAGNOSIS — M79672 Pain in left foot: Secondary | ICD-10-CM

## 2016-06-22 MED ORDER — TRIAMCINOLONE ACETONIDE 10 MG/ML IJ SUSP
10.0000 mg | Freq: Once | INTRAMUSCULAR | Status: AC
  Administered 2016-06-22: 10 mg

## 2016-06-22 NOTE — Progress Notes (Signed)
   Subjective:    Patient ID: Tara Jimenez, female    DOB: 1941/02/17, 75 y.o.   MRN: [id number]  HPI    Review of Systems  All other systems reviewed and are negative.      Objective:   Physical Exam        Assessment & Plan:

## 2016-06-23 NOTE — Progress Notes (Signed)
Subjective:     Patient ID: Tara Jimenez, female   DOB: 03/03/1941, 75 y.o.   MRN: [id number]  HPI patient presents stating she's had a lot of pain in the outside of her left foot with no history of injury and she's had a history of right ankle problems with foot drop   Review of Systems  All other systems reviewed and are negative.      Objective:   Physical Exam  Constitutional: She is oriented to person, place, and time.  Cardiovascular: Intact distal pulses.   Neurological: She is alert and oriented to person, place, and time.  Skin: Skin is warm.  Nursing note and vitals reviewed.  neurovascular status intact muscle strength adequate range of motion within normal limits with patient noted to have inflammatory changes of the base of the fifth metatarsal left with fluid buildup and pain when palpated. Patient has indications of foot drop right and is wearing a brace that does give ankle support but does not address foot drop. Patient's found have good digital perfusion and is well oriented 3     Assessment:     Inflammatory tendinitis base of fifth metatarsal left with fluid buildup along with foot drop deformity right    Plan:     H&P x-rays reviewed and careful sheath injection administered left 3 mg Kenalog 5 mg Xylocaine and advised on heat ice therapy and bracing to lift the lateral side of the foot. For the right I have recommended consideration of a AFO type brace in the future and we reviewed this and she will consider it we will discuss next visit  X-ray report indicates no signs of stress fracture or fracture of the base of fifth metatarsal left with slight reactivity

## 2016-07-07 DIAGNOSIS — Z23 Encounter for immunization: Secondary | ICD-10-CM | POA: Diagnosis not present

## 2016-07-13 ENCOUNTER — Ambulatory Visit: Payer: Medicare Other | Admitting: Podiatry

## 2016-08-03 ENCOUNTER — Encounter: Payer: Self-pay | Admitting: Family Medicine

## 2016-08-03 ENCOUNTER — Ambulatory Visit (INDEPENDENT_AMBULATORY_CARE_PROVIDER_SITE_OTHER): Payer: Medicare Other | Admitting: Family Medicine

## 2016-08-03 VITALS — BP 138/68 | HR 70 | Temp 98.6°F | Ht 67.0 in | Wt 188.8 lb

## 2016-08-03 DIAGNOSIS — E2839 Other primary ovarian failure: Secondary | ICD-10-CM | POA: Diagnosis not present

## 2016-08-03 DIAGNOSIS — M21371 Foot drop, right foot: Secondary | ICD-10-CM | POA: Diagnosis not present

## 2016-08-03 DIAGNOSIS — H9313 Tinnitus, bilateral: Secondary | ICD-10-CM

## 2016-08-03 DIAGNOSIS — Z13820 Encounter for screening for osteoporosis: Secondary | ICD-10-CM | POA: Diagnosis not present

## 2016-08-03 DIAGNOSIS — R030 Elevated blood-pressure reading, without diagnosis of hypertension: Secondary | ICD-10-CM | POA: Diagnosis not present

## 2016-08-03 DIAGNOSIS — E782 Mixed hyperlipidemia: Secondary | ICD-10-CM

## 2016-08-03 LAB — LIPID PANEL
Cholesterol: 203 mg/dL — ABNORMAL HIGH (ref 0–200)
HDL: 66.6 mg/dL (ref >39.00)
LDL CALC: 122 mg/dL — AB (ref 0–99)
NONHDL: 136.18
Total CHOL/HDL Ratio: 3
Triglycerides: 71 mg/dL (ref 0.0–149.0)
VLDL: 14.2 mg/dL (ref 0.0–40.0)

## 2016-08-03 NOTE — Patient Instructions (Addendum)
[facility] urology in [place] Address: [address] [place], [place] [address]   Phone: [phone]  It was very nice to see you today On second thought let me check with your oncologist prior to giving you a shingles vaccine (it is a live vaccine and not for people with immune problems).  Assuming that this is ok we can give it to you next time Please do continue to check your BP about once a week.  If you are running higher than 130/90 please let me know There is generally no cure for tinnitus (ringing in your ears).  However running white noise - fan, etc, at night can be helpful Please do work on getting exercise throughout the year.  Walking is a great option

## 2016-08-03 NOTE — Progress Notes (Signed)
Pre visit review using our clinic review tool, if applicable. No additional management support is needed unless otherwise documented below in the visit note. 

## 2016-08-03 NOTE — Progress Notes (Signed)
[place] at [facility] [address] [place], [place] [address] [phone]  Date:  08/03/2016   Name:  Tara Jimenez   DOB:  September 29, 1940   MRN:  [id number]  PCP:  Gwendolyn Grant, MD    Chief Complaint: Establish Care (Pt here to est care. Pt Due for AWV. Flu vaccine done 06/2016. )   History of Present Illness:  Tara Jimenez is a 75 y.o. very pleasant female patient who presents with the following:  Last seen by myself at [facility] about a year ago, and I did a CPE for her last April as follows:  She is here today for a CPE.  She has recovered well from her subdural hematoma in 2011.  She did have surgery to evacuate the bleed.  She is fasting today for labs.  She has not been dx with HTN per her report - states that she checks her BP regularly at home and that her pressure is generally 120- 130/ 80s.  Her last pap was in 2011. She has never had an abnormal.  She had kept up with her paps prior to then. She would like to DC paps at this time is possible.      BP Readings from Last 3 Encounters:  11/10/14 177/68  09/10/14 182/98  03/17/12 139/74   She has not noted any dark stools or blood in her stools.    We had discussed this issue and her mild anemia back in 2013 but she had not followed up  She would like to get a pneumonia shot today. Last tetanus shot -she is not sure She has always been a bit anemic. This seems to run in her family She did have a colonoscopy in 2011 per [place]  Today she has concern of occasional leg pain- this is thought due to her MGUS I think.  This has been present for over a year and has not changed Last dexa: has not had, would like to schedule Shingles vaccine:  She has not had, would like to do but will first make sure ok with her oncoloigst  She sees Dr. Irene Limbo with heme once for her leukopenia/ neutropenia. Last visit there was in August. She has not needed a bone marrow. She is being seen  again in February   She does check her BP at home- it is generally 130s/ 68- 70 per her report.   She may check her BP a couple of times a month  She is fasting today for labs   SO, Lionel. She has 2 grown daughters and 3 grands, 2 great grands.   Both of her daughters are nearby.  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    She is bothered by ringing in her ears, has been present for years.  It mostly just bothers her at night, is annoying  Admits that she does not get a lot of exercise in the winter months- during the warmer months she will exercise mostly in her garden  Patient Active Problem List   Diagnosis Date Noted  . MGUS (monoclonal gammopathy of unknown significance) 08/17/2015  . Leukopenia 07/22/2015  . Paresthesias 07/22/2015  . Bone pain 07/22/2015  . Microscopic hematuria 02/06/2015  . Subdural hematoma ([place]) 02/19/2010  . OBESITY 02/15/2010  . PNEUMONIA 02/15/2010  . GERD 02/15/2010  . ARTHRITIS 02/15/2010  . ANKLE INJURY, RIGHT 02/15/2010  . UTI'S, HX OF 02/15/2010    Past Medical History:  Diagnosis Date  . Arthritis   . Glaucoma     Past Surgical History:  Procedure Laterality Date  . BRAIN SURGERY      Social History  Substance Use Topics  . Smoking status: Never Smoker  . Smokeless tobacco: Never Used  . Alcohol use No    Family History  Problem Relation Age of Onset  . Diabetes Brother   . Heart disease Brother   . Hypertension Brother   . Aneurysm Brother     No Known Allergies  Medication list has been reviewed and updated.  Current Outpatient Prescriptions on File Prior to Visit  Medication Sig Dispense Refill  . aspirin EC 81 MG tablet Take 81 mg by mouth daily.    Marland Kitchen b complex vitamins capsule Take 1 capsule by mouth daily. 60 capsule 3  . latanoprost (XALATAN) 0.005 % ophthalmic solution Place 1 drop into both eyes at bedtime.     . Multiple Vitamins-Minerals (MULTIVITAMIN WITH MINERALS) tablet Take 1 tablet by mouth  daily. 30 tablet 3   No current facility-administered medications on file prior to visit.     Review of Systems:  As per HPI- otherwise negative.   Physical Examination: Vitals:   08/03/16 0820 08/03/16 0828  BP: (!) 148/68 (!) 144/62  Pulse: 70   Temp: 98.6 F (37 C)    Vitals:   08/03/16 0820  Weight: 188 lb 12.8 oz (85.6 kg)  Height: 5\' 7"  (1.702 m)   Body mass index is 29.57 kg/m. Ideal Body Weight: Weight in (lb) to have BMI = 25: 159.3  GEN: WDWN, NAD, Non-toxic, A & O x 3, overweight, otherwise looks well HEENT: Atraumatic, Normocephalic. Neck supple. No masses, No LAD.  Bilateral TM wnl, oropharynx normal.  PEERL,EOMI.   Ears and Nose: No external deformity. CV: RRR, No M/G/R. No JVD. No thrill. No extra heart sounds. PULM: CTA B, no wheezes, crackles, rhonchi. No retractions. No resp. distress. No accessory muscle use. ABD: S, NT, ND, No HSM. EXTR: No c/c.  Wears a velcro ankle brace on the right to support her foot  NEURO Normal gait.  PSYCH: Normally interactive. Conversant. Not depressed or anxious appearing.  Calm demeanor.    Assessment and Plan: Borderline blood pressure  Right foot drop  Screening for osteoporosis - Plan: DG Bone Density  Estrogen deficiency - Plan: DG Bone Density  Mixed hyperlipidemia - Plan: Lipid panel  Tinnitus of both ears  Here today for a follow-up visit Check lipids- her other labs are UTD Never had any elevated glucose so no A1c test Schedule dexa for her Offered ENT referral for her tinnitus- she declines  See patient instructions for more details.     Signed Lamar Blinks, MD

## 2016-08-10 ENCOUNTER — Other Ambulatory Visit: Payer: Medicare Other

## 2016-08-12 ENCOUNTER — Ambulatory Visit
Admission: RE | Admit: 2016-08-12 | Discharge: 2016-08-12 | Disposition: A | Discharge status: Home / Self Care | Payer: Medicare Other | Source: Ambulatory Visit | Attending: Family Medicine | Admitting: Family Medicine

## 2016-08-12 ENCOUNTER — Encounter: Payer: Self-pay | Admitting: Family Medicine

## 2016-08-12 DIAGNOSIS — Z13820 Encounter for screening for osteoporosis: Secondary | ICD-10-CM | POA: Diagnosis not present

## 2016-08-12 DIAGNOSIS — Z780 Asymptomatic menopausal state: Secondary | ICD-10-CM | POA: Diagnosis not present

## 2016-08-12 DIAGNOSIS — E2839 Other primary ovarian failure: Secondary | ICD-10-CM

## 2016-09-15 ENCOUNTER — Ambulatory Visit (HOSPITAL_BASED_OUTPATIENT_CLINIC_OR_DEPARTMENT_OTHER): Payer: Medicare Other | Admitting: Hematology

## 2016-09-15 ENCOUNTER — Other Ambulatory Visit (HOSPITAL_BASED_OUTPATIENT_CLINIC_OR_DEPARTMENT_OTHER): Payer: Medicare Other

## 2016-09-15 VITALS — BP 119/61 | HR 81 | Temp 97.9°F | Resp 18 | Ht 67.0 in | Wt 188.1 lb

## 2016-09-15 DIAGNOSIS — D709 Neutropenia, unspecified: Secondary | ICD-10-CM

## 2016-09-15 DIAGNOSIS — D472 Monoclonal gammopathy: Secondary | ICD-10-CM

## 2016-09-15 DIAGNOSIS — D72819 Decreased white blood cell count, unspecified: Secondary | ICD-10-CM

## 2016-09-15 LAB — COMPREHENSIVE METABOLIC PANEL
ALK PHOS: 53 U/L (ref 40–150)
ALT: 17 U/L (ref 0–55)
ANION GAP: 7 meq/L (ref 3–11)
AST: 17 U/L (ref 5–34)
Albumin: 3.6 g/dL (ref 3.5–5.0)
BILIRUBIN TOTAL: 0.46 mg/dL (ref 0.20–1.20)
BUN: 13.7 mg/dL (ref 7.0–26.0)
CALCIUM: 9.4 mg/dL (ref 8.4–10.4)
CO2: 26 mEq/L (ref 22–29)
CREATININE: 0.8 mg/dL (ref 0.6–1.1)
Chloride: 109 mEq/L (ref 98–109)
EGFR: 80 mL/min/{1.73_m2} — AB (ref >90)
Glucose: 91 mg/dl (ref 70–140)
Potassium: 4.6 mEq/L (ref 3.5–5.1)
Sodium: 142 mEq/L (ref 136–145)
TOTAL PROTEIN: 7.3 g/dL (ref 6.4–8.3)

## 2016-09-15 LAB — CBC & DIFF AND RETIC
BASO%: 0.3 % (ref 0.0–2.0)
Basophils Absolute: 0 10*3/uL (ref 0.0–0.1)
EOS ABS: 0.1 10*3/uL (ref 0.0–0.5)
EOS%: 2.1 % (ref 0.0–7.0)
HEMATOCRIT: 38.4 % (ref 34.8–46.6)
HGB: 12.1 g/dL (ref 11.6–15.9)
IMMATURE RETIC FRACT: 2.4 % (ref 1.60–10.00)
LYMPH%: 42.5 % (ref 14.0–49.7)
MCH: 29.5 pg (ref 25.1–34.0)
MCHC: 31.5 g/dL (ref 31.5–36.0)
MCV: 93.7 fL (ref 79.5–101.0)
MONO#: 0.2 10*3/uL (ref 0.1–0.9)
MONO%: 5.7 % (ref 0.0–14.0)
NEUT#: 1.7 10*3/uL (ref 1.5–6.5)
NEUT%: 49.4 % (ref 38.4–76.8)
PLATELETS: 194 10*3/uL (ref 145–400)
RBC: 4.1 10*6/uL (ref 3.70–5.45)
RDW: 13.3 % (ref 11.2–14.5)
RETIC CT ABS: 48.38 10*3/uL (ref 33.70–90.70)
Retic %: 1.18 % (ref 0.70–2.10)
WBC: 3.3 10*3/uL — AB (ref 3.9–10.3)
lymph#: 1.4 10*3/uL (ref 0.9–3.3)
nRBC: 0 % (ref 0–0)

## 2016-09-17 ENCOUNTER — Telehealth: Payer: Self-pay

## 2016-09-17 NOTE — Telephone Encounter (Signed)
Called patient with new appt per 2/8 los and no voicemail.  A calendar was mailed to the patient

## 2016-09-18 NOTE — Progress Notes (Signed)
[facility]    HEMATOLOGY/ONCOLOGY CLINIC NOTE  Date of Service: 09/15/2016  Patient Care Team: Darreld Mclean, MD as PCP - General (Family Medicine)  CHIEF COMPLAINTS/PURPOSE OF CONSULTATION:  Leucopenia  HISTORY OF PRESENTING ILLNESS: please see my initial consultation for details of her initial presentation  Interval history  Tara Jimenez is here for a followup for her leukopenia/neutropenia and IgA MGUS.  Notes no issues with new infections. Feels well. No acute new symptoms. No abnormal weight loss/night sweats/fevers/chills. No new bone pains. Patient's neutropenia has resolved.  MEDICAL HISTORY:  Past Medical History:  Diagnosis Date  . Arthritis   . Glaucoma   . Patient Active Problem List   Diagnosis Date Noted  . Right foot drop 08/03/2016  . Borderline blood pressure 08/03/2016  . MGUS (monoclonal gammopathy of unknown significance) 08/17/2015  . Leukopenia 07/22/2015  . Paresthesias 07/22/2015  . Bone pain 07/22/2015  . Microscopic hematuria 02/06/2015  . Subdural hematoma ([place]) 02/19/2010  . OBESITY 02/15/2010  . GERD 02/15/2010  . ARTHRITIS 02/15/2010  . UTI'S, HX OF 02/15/2010    SURGICAL HISTORY: Past Surgical History:  Procedure Laterality Date  . BRAIN SURGERY      SOCIAL HISTORY: Social History   Social History  . Marital status: Single    Spouse name: N/A  . Number of children: N/A  . Years of education: N/A   Occupational History  . Not on file.   Social History Main Topics  . Smoking status: Never Smoker  . Smokeless tobacco: Never Used  . Alcohol use No  . Drug use: No  . Sexual activity: Not on file   Other Topics Concern  . Not on file   Social History Narrative  . No narrative on file   No issues with ETOH use.  FAMILY HISTORY: Family History  Problem Relation Age of Onset  . Diabetes Brother   . Heart disease Brother   . Hypertension Brother   . Aneurysm Brother     ALLERGIES:  has No Known  Allergies.  MEDICATIONS:  Current Outpatient Prescriptions  Medication Sig Dispense Refill  . aspirin EC 81 MG tablet Take 81 mg by mouth daily.    Marland Kitchen latanoprost (XALATAN) 0.005 % ophthalmic solution Place 1 drop into both eyes at bedtime.      No current facility-administered medications for this visit.   Advil as needed for arthritis  REVIEW OF SYSTEMS:    10 Point review of Systems was done is negative except as noted above.  PHYSICAL EXAMINATION: ECOG PERFORMANCE STATUS: 1 - Symptomatic but completely ambulatory  . Vitals:   09/15/16 1029  BP: 119/61  Pulse: 81  Resp: 18  Temp: 97.9 F (36.6 C)   Filed Weights   09/15/16 1029  Weight: 188 lb 1.6 oz (85.3 kg)   .Body mass index is 29.46 kg/m.  GENERAL:alert, in no acute distress and comfortable SKIN: skin color, texture, turgor are normal, no rashes or significant lesions EYES: normal, conjunctiva are pink and non-injected, sclera clear OROPHARYNX:no exudate, no erythema and lips, buccal mucosa, and tongue normal  NECK: supple, no JVD, thyroid normal size, non-tender, without nodularity LYMPH:  no palpable lymphadenopathy in the cervical, axillary or inguinal LUNGS: clear to auscultation with normal respiratory effort HEART: regular rate & rhythm,  no murmurs and no lower extremity edema ABDOMEN: abdomen soft, non-tender, normoactive bowel sounds  Musculoskeletal: no cyanosis of digits and no clubbing  PSYCH: alert & oriented x 3 with fluent speech NEURO: no  focal motor/sensory deficits  LABORATORY DATA:  I have reviewed the data as listed  . CBC Latest Ref Rng & Units 09/15/2016 03/15/2016 09/16/2015  WBC 3.9 - 10.3 10e3/uL 3.3(L) 3.2(L) 3.1(L)  Hemoglobin 11.6 - 15.9 g/dL 12.1 12.1 11.8  Hematocrit 34.8 - 46.6 % 38.4 37.4 36.4  Platelets 145 - 400 10e3/uL 194 197 187   ANC 1.7  (2/8/018) . CMP Latest Ref Rng & Units 09/15/2016 03/15/2016 03/15/2016  Glucose 70 - 140 mg/dl 91 91 -  BUN 7.0 - 26.0 mg/dL 13.7 13.8 -   Creatinine 0.6 - 1.1 mg/dL 0.8 0.8 -  Sodium 136 - 145 mEq/L 142 143 -  Potassium 3.5 - 5.1 mEq/L 4.6 4.4 -  Chloride 98 - 110 mmol/L - - -  CO2 22 - 29 mEq/L 26 26 -  Calcium 8.4 - 10.4 mg/dL 9.4 9.4 -  Total Protein 6.4 - 8.3 g/dL 7.3 7.7 7.0  Total Bilirubin 0.20 - 1.20 mg/dL 0.46 0.47 -  Alkaline Phos 40 - 150 U/L 53 48 -  AST 5 - 34 U/L 17 20 -  ALT 0 - 55 U/L 17 19 -   Component     Latest Ref Rng & Units 03/15/2016  IgG (Immunoglobin G), Serum     700 - 1600 mg/dL 1,349  IgA/Immunoglobulin A, Serum     64 - 422 mg/dL 476 (H)  IgM, Qn, Serum     26 - 217 mg/dL 27  Total Protein     6.0 - 8.5 g/dL 7.0  Albumin SerPl Elph-Mcnc     2.9 - 4.4 g/dL 3.6  Alpha 1     0.0 - 0.4 g/dL 0.2  Alpha2 Glob SerPl Elph-Mcnc     0.4 - 1.0 g/dL 0.8  B-Globulin SerPl Elph-Mcnc     0.7 - 1.3 g/dL 1.2  Gamma Glob SerPl Elph-Mcnc     0.4 - 1.8 g/dL 1.2  M Protein SerPl Elph-Mcnc     Not Observed g/dL Not Observed  Globulin, Total     2.2 - 3.9 g/dL 3.4  Albumin/Glob SerPl     0.7 - 1.7 1.1  IFE 1      Comment  Please Note (HCV):      Comment  Ig Kappa Free Light Chain     3.3 - 19.4 mg/L 22.6 (H)  Ig Lambda Free Light Chain     5.7 - 26.3 mg/L 18.7  Kappa/Lambda FluidC Ratio     0.26 - 1.65 1.21    Multiple Myeloma Panel (SPEP&IFE w/QIG)      No reference range information available      Comments:           Immunofixation shows IgA monoclonal protein with kappa light           chain           specificity.  RADIOGRAPHIC STUDIES: I have personally reviewed the radiological images as listed and agreed with the findings in the report. No results found.  ASSESSMENT & PLAN:   76 yo AAF with   1) Chronic leucopenia/neutropenia. Relatively stable  WBC count 3.3k with ANC 1.7k. no clear association with increased infections. Likely some baseline leucopenia (?Benign ethnic neutropenia - patient has AA ethnicity) Peripheral blood smear shows increased large granular lymphocytes  that could be reactive to a viral infection or might suggestive mild LGL leukemia. Peripheral blood flow cytometry did not show any overt monoclonal B or T-cell population. However T-cell receptor gene rearrangement  studies shows a positive gene rearrangement forT cell cells suggesting the possibility of T-LGL Plan - Patient has no neutropenia at this time. -Would need monitoring of her leukopenia and neutropenia since that is a possibility of T-LGL. -might need to consider treating if ANC <1000 especially with frequent infections. -no indication for G-CSF at this time. -no other associated cytopenias at this time.  2) Bone pains and paresthesias ? Neuropathy. SPEP - showed IgA kappa MGUS with M spike on 0.3g/dl Bone survey with no skeletal lesions attributable to Multiple myeloma. Rpt myeloma panel 03/15/2016 with no M spike but has IFE has on monoclonal IgA protein with kappa light chain specificity. K/L ratio within normal limits Plan F/u SPEP from today - still pending.  Recommend -Recheck CBC with differential and SPEP with primary care physician in 6 months.  RTC with Dr Irene Limbo in 12 months with cbc, cmp. Myeloma panel. Earlier if labs abnormal or worsening neutropenia or significantly increasing M spike.  -continue f/u with PCP for management of other medical issues.   All of the patients questions were answered with apparent satisfaction. The patient knows to call the clinic with any problems, questions or concerns.  I spent 20 minutes counseling the patient face to face. The total time spent in the appointment was 20 minutes and more than 50% was on counseling and direct patient cares.    Tara Lone MD [place] AAHIVMS [facility] Hematology/Oncology Physician [facility]  (Office):       [phone] (Work cell):  [phone] (Fax):           [phone]

## 2016-09-19 LAB — MULTIPLE MYELOMA PANEL, SERUM
ALBUMIN SERPL ELPH-MCNC: 3.4 g/dL (ref 2.9–4.4)
ALPHA2 GLOB SERPL ELPH-MCNC: 0.9 g/dL (ref 0.4–1.0)
Albumin/Glob SerPl: 1 (ref 0.7–1.7)
Alpha 1: 0.2 g/dL (ref 0.0–0.4)
B-GLOBULIN SERPL ELPH-MCNC: 1.1 g/dL (ref 0.7–1.3)
GAMMA GLOB SERPL ELPH-MCNC: 1.3 g/dL (ref 0.4–1.8)
GLOBULIN, TOTAL: 3.5 g/dL (ref 2.2–3.9)
IgA, Qn, Serum: 491 mg/dL — ABNORMAL HIGH (ref 64–422)
IgM, Qn, Serum: 27 mg/dL (ref 26–217)
Total Protein: 6.9 g/dL (ref 6.0–8.5)

## 2016-12-08 ENCOUNTER — Other Ambulatory Visit: Payer: Self-pay | Admitting: Family Medicine

## 2016-12-08 DIAGNOSIS — Z1231 Encounter for screening mammogram for malignant neoplasm of breast: Secondary | ICD-10-CM

## 2016-12-14 DIAGNOSIS — H524 Presbyopia: Secondary | ICD-10-CM | POA: Diagnosis not present

## 2016-12-14 DIAGNOSIS — H2513 Age-related nuclear cataract, bilateral: Secondary | ICD-10-CM | POA: Diagnosis not present

## 2016-12-14 DIAGNOSIS — H401131 Primary open-angle glaucoma, bilateral, mild stage: Secondary | ICD-10-CM | POA: Diagnosis not present

## 2017-01-06 ENCOUNTER — Ambulatory Visit
Admission: RE | Admit: 2017-01-06 | Discharge: 2017-01-06 | Disposition: A | Discharge status: Home / Self Care | Payer: Medicare Other | Source: Ambulatory Visit | Attending: Family Medicine | Admitting: Family Medicine

## 2017-01-06 DIAGNOSIS — Z1231 Encounter for screening mammogram for malignant neoplasm of breast: Secondary | ICD-10-CM | POA: Diagnosis not present

## 2017-03-21 ENCOUNTER — Telehealth: Payer: Self-pay | Admitting: Family Medicine

## 2017-03-21 NOTE — Telephone Encounter (Signed)
[facility] - Client TELEPHONE ADVICE RECORD [facility]  Patient Name: Tara Jimenez  DOB: [birth year]    Initial Comment Caller states having high bp, one seventy something over eighty something    Nurse Assessment  Nurse: Harlow Mares, RN, Suanne Marker Date/Time (Eastern Time): 03/21/2017 9:51:03 AM  Confirm and document reason for call. If symptomatic, describe symptoms. ---Caller states having high bp, one 170/80. Reports that she feels fine, doesn't take BP meds.  Does the patient have any new or worsening symptoms? ---Yes  Will a triage be completed? ---Yes  Related visit to physician within the last 2 weeks? ---No  Does the PT have any chronic conditions? (i.e. diabetes, asthma, etc.) ---Yes  List chronic conditions. ---glaucoma  Is this a behavioral health or substance abuse call? ---No     Guidelines    Guideline Title Affirmed Question Affirmed Notes  High Blood Pressure Systolic BP >= 514 OR Diastolic >= 604    Final Disposition User   See PCP When Office is Open (within 3 days) Harlow Mares, RN, Suanne Marker    Comments  Caller reports that she has appt tomorrow, and she is not certain that her BP cuff is accurate. Advised to take her cuff with her to the MD office for a comparison reading.   Referrals  REFERRED TO PCP OFFICE   Disagree/Comply: Comply

## 2017-03-22 ENCOUNTER — Ambulatory Visit (INDEPENDENT_AMBULATORY_CARE_PROVIDER_SITE_OTHER): Payer: Medicare Other | Admitting: Family Medicine

## 2017-03-22 VITALS — BP 126/60 | HR 99 | Temp 98.8°F | Ht 67.0 in | Wt 191.4 lb

## 2017-03-22 DIAGNOSIS — R030 Elevated blood-pressure reading, without diagnosis of hypertension: Secondary | ICD-10-CM

## 2017-03-22 NOTE — Progress Notes (Signed)
[place] at [facility] [address] [place], [place] [address] [phone]  Date:  03/22/2017   Name:  Tara Jimenez   DOB:  [birth year]   MRN:  [id number]  PCP:  Darreld Mclean, MD    Chief Complaint: Hypertension   History of Present Illness:  Tara Jimenez is a 76 y.o. very pleasant female patient who presents with the following:  Last seen by myself in January of 17-  She checks her BP at home, generally a few times a week.   Over the last couple of weeks her BP meter has been giving her strange readings- may be too high and then too low.  It has been fluctuating a lot and she is not sure why She is feeling well- no concerns.   No CP or SOB She did not get out in her garden much this year and is not getting as much exercise  Today she has concern of occasional leg pain- this is thought due to her MGUS I think.  This has been present for over a year and has not changed Last dexa: has not had, would like to schedule Shingles vaccine:  She has not had, would like to do but will first make sure ok with her oncoloigst  She sees Dr. Irene Limbo with heme once for her leukopenia/ neutropenia. Last visit there was in August. She has not needed a bone marrow. She is being seen again in February   She does check her BP at home- it is generally 130s/ 68- 70 per her report.   She may check her BP a couple of times a month  She is fasting today for labs   SO, Lionel. She has 2 grown daughters and 3 grands, 2 great grands.   Both of her daughters are nearby.  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    She is bothered by ringing in her ears, has been present for years.  It mostly just bothers her at night, is annoying  Admits that she does not get a lot of exercise in the winter months- during the warmer months she will exercise mostly in her garden BP Readings from Last 3 Encounters:  03/22/17 126/60  09/15/16 119/61   08/03/16 138/68     Patient Active Problem List   Diagnosis Date Noted  . Right foot drop 08/03/2016  . Borderline blood pressure 08/03/2016  . MGUS (monoclonal gammopathy of unknown significance) 08/17/2015  . Leukopenia 07/22/2015  . Paresthesias 07/22/2015  . Bone pain 07/22/2015  . Microscopic hematuria 02/06/2015  . Subdural hematoma ([place]) 02/19/2010  . OBESITY 02/15/2010  . GERD 02/15/2010  . ARTHRITIS 02/15/2010  . UTI'S, HX OF 02/15/2010    Past Medical History:  Diagnosis Date  . Arthritis   . Glaucoma     Past Surgical History:  Procedure Laterality Date  . BRAIN SURGERY      Social History  Substance Use Topics  . Smoking status: Never Smoker  . Smokeless tobacco: Never Used  . Alcohol use No    Family History  Problem Relation Age of Onset  . Diabetes Brother   . Heart disease Brother   . Hypertension Brother   . Aneurysm Brother     No Known Allergies  Medication list has been reviewed and updated.  Current Outpatient Prescriptions on File Prior to Visit  Medication Sig Dispense Refill  . aspirin EC 81 MG tablet Take  81 mg by mouth daily.    Marland Kitchen latanoprost (XALATAN) 0.005 % ophthalmic solution Place 1 drop into both eyes at bedtime.      No current facility-administered medications on file prior to visit.     Review of Systems:  As per HPI- otherwise negative. No fever or chills No CP or sob  Wt Readings from Last 3 Encounters:  03/22/17 191 lb 6.4 oz (86.8 kg)  09/15/16 188 lb 1.6 oz (85.3 kg)  08/03/16 188 lb 12.8 oz (85.6 kg)   Weight is stable No LE edema   Physical Examination: Vitals:   03/22/17 1013  BP: 126/60  Pulse: 99  Temp: 98.8 F (37.1 C)   Vitals:   03/22/17 1013  Weight: 191 lb 6.4 oz (86.8 kg)  Height: 5\' 7"  (1.702 m)   Body mass index is 29.98 kg/m. Ideal Body Weight: Weight in (lb) to have BMI = 25: 159.3  GEN: WDWN, NAD, Non-toxic, A & O x 3, looks well HEENT: Atraumatic, Normocephalic. Neck  supple. No masses, No LAD. Ears and Nose: No external deformity. CV: RRR, No M/G/R. No JVD. No thrill. No extra heart sounds. PULM: CTA B, no wheezes, crackles, rhonchi. No retractions. No resp. distress. No accessory muscle use. EXTR: No c/c/e NEURO Normal gait.  PSYCH: Normally interactive. Conversant. Not depressed or anxious appearing.  Calm demeanor.   182/94 on her machine 142/87 on our machine Wears a right ankle brace for her foot drop Assessment and Plan: Elevated blood pressure reading  Pt is here concerned about her BP- however, we suspect her home meter is not working properly She will get a new one and let me know if she continues to have any concerns Otherwise she will see me for her CPE this fall- she is not sure if she ever had pneumovax at 66 so we will update at her CPE  Signed Lamar Blinks, MD

## 2017-03-22 NOTE — Patient Instructions (Addendum)
I suspect that your blood pressure meter is not working properly.  You probably need to get a new one We will give you your last pneumonia vaccine when we see you for your physical el

## 2017-06-15 DIAGNOSIS — H2513 Age-related nuclear cataract, bilateral: Secondary | ICD-10-CM | POA: Diagnosis not present

## 2017-06-15 DIAGNOSIS — H25013 Cortical age-related cataract, bilateral: Secondary | ICD-10-CM | POA: Diagnosis not present

## 2017-06-15 DIAGNOSIS — H401131 Primary open-angle glaucoma, bilateral, mild stage: Secondary | ICD-10-CM | POA: Diagnosis not present

## 2017-08-05 DIAGNOSIS — Z23 Encounter for immunization: Secondary | ICD-10-CM | POA: Diagnosis not present

## 2017-08-17 ENCOUNTER — Ambulatory Visit: Payer: Medicare Other | Admitting: Family Medicine

## 2017-09-07 ENCOUNTER — Encounter: Payer: Medicare Other | Admitting: Family Medicine

## 2017-09-17 NOTE — Progress Notes (Addendum)
[facility] [address] [place], [place] [address] [phone]  Date:  09/18/2017   Name:  Tara Jimenez   DOB:  1940-09-11   MRN:  [id number]  PCP:  Darreld Mclean, MD    Chief Complaint: Annual Exam (Pt here for CPE. )   History of Present Illness:  Eleshia Wooley is a 77 y.o. very pleasant female patient who presents with the following:  Here today for a CPE (Will not code for a CPE however due to her type of health insurance) History of MGUS, subdural hematoma in 2011, arthritis, GERD  From last oncology note about one year ago: 1) Chronic leucopenia/neutropenia. Relatively stable  WBC count 3.3k with ANC 1.7k. no clear association with increased infections. Likely some baseline leucopenia (?Benign ethnic neutropenia - patient has AA ethnicity) Peripheral blood smear shows increased large granular lymphocytes that could be reactive to a viral infection or might suggestive mild LGL leukemia. Peripheral blood flow cytometry did not show any overt monoclonal B or T-cell population. However T-cell receptor gene rearrangement studies shows a positive gene rearrangement forT cell cells suggesting the possibility of T-LGL Plan - Patient has no neutropenia at this time. -Would need monitoring of her leukopenia and neutropenia since that is a possibility of T-LGL. -might need to consider treating if ANC <1000 especially with frequent infections. -no indication for G-CSF at this time. -no other associated cytopenias at this time.  2) Bone pains and paresthesias ? Neuropathy. SPEP - showed IgA kappa MGUS with M spike on 0.3g/dl Bone survey with no skeletal lesions attributable to Multiple myeloma. Rpt myeloma panel 03/15/2016 with no M spike but has IFE has on monoclonal IgA protein with kappa light chain specificity. K/L ratio within normal limits Plan F/u SPEP from today - still pending.  I last saw her in August  for concern of elevated BP at home- however there was a large discrepancy between her and out BP cuffs so we suspected that her home cuff was not accurate. She has gotten a new cuff now and is getting much better numbers  SO, Lionel. She has 2 grown daughters and 3 grands, 3 great grands.  Both of her daughters are nearby.  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   BP Readings from Last 3 Encounters:  09/18/17 136/74  03/22/17 126/60  09/15/16 119/61   Colon: 9/11 Mammo: 6/18 Pap: last done 5-6 years ago.  Never had an abnl. No bleeding.   She declines to continue pap today Labs: a year ago Dexa: 1/18 Flu: done Needs pneumovax 23- will do for her today   Never had elevated glucose  Her home BP is generally 120-130s/70s.   She is fasting today for labs   She may sometimes get pains in her left shoulder- this has been present off an on for several years It can hurt "any time," she will use tylenol or advil and this helps We did get her some meloxicam for this in the past that seemed to help, she is out of this  Pain is not exertional, she does not have any CP or SOB States that she was told that she had bursitis in the past   Also she notes intermittent pains in the dorsal hands- both- for about one year.  She is not aware of any sort of pattern to her pain, does not have any numbness or weakness here  She works  in her garden for exercise in the nicer weather.  Right now is not getting much exercise However she denies any CP or SOB while doing chores around the house Generally she is feeling well   Patient Active Problem List   Diagnosis Date Noted  . Right foot drop 08/03/2016  . Borderline blood pressure 08/03/2016  . MGUS (monoclonal gammopathy of unknown significance) 08/17/2015  . Leukopenia 07/22/2015  . Paresthesias 07/22/2015  . Bone pain 07/22/2015  . Microscopic hematuria 02/06/2015  . Subdural hematoma ([facility]) 02/19/2010  . OBESITY 02/15/2010  . GERD  02/15/2010  . ARTHRITIS 02/15/2010  . UTI'S, HX OF 02/15/2010    Past Medical History:  Diagnosis Date  . Arthritis   . Glaucoma     Past Surgical History:  Procedure Laterality Date  . BRAIN SURGERY      Social History   Tobacco Use  . Smoking status: Never Smoker  . Smokeless tobacco: Never Used  Substance Use Topics  . Alcohol use: No    Alcohol/week: 0.0 oz  . Drug use: No    Family History  Problem Relation Age of Onset  . Diabetes Brother   . Heart disease Brother   . Hypertension Brother   . Aneurysm Brother     No Known Allergies  Medication list has been reviewed and updated.  Current Outpatient Medications on File Prior to Visit  Medication Sig Dispense Refill  . aspirin EC 81 MG tablet Take 81 mg by mouth daily.    Marland Kitchen latanoprost (XALATAN) 0.005 % ophthalmic solution Place 1 drop into both eyes at bedtime.      No current facility-administered medications on file prior to visit.     Review of Systems:  As per HPI- otherwise negative.  Physical Examination: Vitals:   09/18/17 0859  BP: 136/74  Pulse: 73  Temp: 98.6 F (37 C)  SpO2: 98%   Vitals:   09/18/17 0859  Weight: 185 lb (83.9 kg)  Height: 5' 7"  (1.702 m)   Body mass index is 28.98 kg/m. Ideal Body Weight: Weight in (lb) to have BMI = 25: 159.3  GEN: WDWN, NAD, Non-toxic, A & O x 3, overweight, looks well  HEENT: Atraumatic, Normocephalic. Neck supple. No masses, No LAD.  Bilateral TM wnl, oropharynx normal.  PEERL,EOMI.   Ears and Nose: No external deformity. CV: RRR, No M/G/R. No JVD. No thrill. No extra heart sounds. PULM: CTA B, no wheezes, crackles, rhonchi. No retractions. No resp. distress. No accessory muscle use. ABD: S, NT, ND EXTR: No c/c/e NEURO Normal gait.  PSYCH: Normally interactive. Conversant. Not depressed or anxious appearing.  Calm demeanor.  Left shoulder with full ROM and strength, no tenderness or pain with movement or palpation Both hands are normal  on exam, good strength and perfusion No visible or palpable joint degenerative changes in her hands    Assessment and Plan: Elevated blood pressure reading - Plan: CBC  Subdural hematoma (HCC)  Right foot drop  Mixed hyperlipidemia - Plan: Lipid panel  Borderline blood pressure - Plan: CBC, Comprehensive metabolic panel  Immunization due - Plan: Pneumococcal polysaccharide vaccine 23-valent greater than or equal to 2yo subcutaneous/IM  Chronic left shoulder pain - Plan: DG Shoulder Left, meloxicam (MOBIC) 7.5 MG tablet  Bilateral hand pain  Here today for a CPE Update labs today Gave pneumovax 23 Shoulder films pending today- refilled her mobic for her to use as needed  Will plan further follow- up pending labs.   Signed  Lamar Blinks, MD  Received her labs and shoulder films- message to pt  Your white blood cell count is mildly low as is typical for you Metabolic profile is normal Your cholesterol is overall favorable Your shoulder x-rays do show arthritis in the shoulder.  Do you want to see an orthopedist or sports med doc to have an injection to your shoulder? This may be helpful with your pain Lab Results  Component Value Date   WBC 3.0 (L) 09/18/2017   HGB 12.0 09/18/2017   HCT 38.0 09/18/2017   MCV 90.3 09/18/2017   PLT 205 09/18/2017     Results for orders placed or performed in visit on 09/18/17  Comprehensive metabolic panel  Result Value Ref Range   Sodium 141 135 - 145 mEq/L   Potassium 4.0 3.5 - 5.1 mEq/L   Chloride 106 96 - 112 mEq/L   CO2 27 19 - 32 mEq/L   Glucose, Bld 90 70 - 99 mg/dL   BUN 15 6 - 23 mg/dL   Creatinine, Ser 0.76 0.40 - 1.20 mg/dL   Total Bilirubin 0.4 0.2 - 1.2 mg/dL   Alkaline Phosphatase 44 39 - 117 U/L   AST 16 0 - 37 U/L   ALT 14 0 - 35 U/L   Total Protein 7.4 6.0 - 8.3 g/dL   Albumin 3.9 3.5 - 5.2 g/dL   Calcium 9.0 8.4 - 10.5 mg/dL   GFR 95.35 >60.00 mL/min  Lipid panel  Result Value Ref Range   Cholesterol  199 0 - 200 mg/dL   Triglycerides 70.0 0.0 - 149.0 mg/dL   HDL 53.50 >39.00 mg/dL   VLDL 14.0 0.0 - 40.0 mg/dL   LDL Cholesterol 131 (H) 0 - 99 mg/dL   Total CHOL/HDL Ratio 4    NonHDL 145.20    Dg Shoulder Left  Result Date: 09/18/2017 CLINICAL DATA:  Intermittent left shoulder pain for several years. No known injury. EXAM: LEFT SHOULDER - 2+ VIEW COMPARISON:  None. FINDINGS: No fracture or dislocation. Mild degenerative change of the glenohumeral joint with joint space loss, subchondral sclerosis and inferiorly directed osteophytosis. Mild-to-moderate degenerative change of the left acromioclavicular joint with joint space loss and inferiorly directed osteophytosis. Minimal avulsive change involving the tip of the acromion. No evidence of calcific tendinitis. Limited visualization of the adjacent thorax is normal. Regional soft tissues appear normal. IMPRESSION: Mild to moderate degenerative change of the left acromioclavicular and glenohumeral joints. Electronically Signed   By: Sandi Mariscal M.D.   On: 09/18/2017 10:02

## 2017-09-18 ENCOUNTER — Ambulatory Visit (HOSPITAL_BASED_OUTPATIENT_CLINIC_OR_DEPARTMENT_OTHER)
Admission: RE | Admit: 2017-09-18 | Discharge: 2017-09-18 | Disposition: A | Discharge status: Home / Self Care | Payer: Medicare Other | Source: Ambulatory Visit | Attending: Family Medicine | Admitting: Family Medicine

## 2017-09-18 ENCOUNTER — Encounter: Payer: Self-pay | Admitting: Family Medicine

## 2017-09-18 ENCOUNTER — Other Ambulatory Visit: Payer: Medicare Other

## 2017-09-18 ENCOUNTER — Ambulatory Visit (INDEPENDENT_AMBULATORY_CARE_PROVIDER_SITE_OTHER): Payer: Medicare Other | Admitting: Family Medicine

## 2017-09-18 VITALS — BP 136/74 | HR 73 | Temp 98.6°F | Ht 67.0 in | Wt 185.0 lb

## 2017-09-18 DIAGNOSIS — M25512 Pain in left shoulder: Secondary | ICD-10-CM | POA: Diagnosis not present

## 2017-09-18 DIAGNOSIS — M19012 Primary osteoarthritis, left shoulder: Secondary | ICD-10-CM | POA: Diagnosis not present

## 2017-09-18 DIAGNOSIS — M21371 Foot drop, right foot: Secondary | ICD-10-CM | POA: Diagnosis not present

## 2017-09-18 DIAGNOSIS — G8929 Other chronic pain: Secondary | ICD-10-CM

## 2017-09-18 DIAGNOSIS — M79642 Pain in left hand: Secondary | ICD-10-CM

## 2017-09-18 DIAGNOSIS — S065X9A Traumatic subdural hemorrhage with loss of consciousness of unspecified duration, initial encounter: Secondary | ICD-10-CM

## 2017-09-18 DIAGNOSIS — M79641 Pain in right hand: Secondary | ICD-10-CM

## 2017-09-18 DIAGNOSIS — R030 Elevated blood-pressure reading, without diagnosis of hypertension: Secondary | ICD-10-CM

## 2017-09-18 DIAGNOSIS — E782 Mixed hyperlipidemia: Secondary | ICD-10-CM | POA: Diagnosis not present

## 2017-09-18 DIAGNOSIS — Z23 Encounter for immunization: Secondary | ICD-10-CM

## 2017-09-18 DIAGNOSIS — S065XAA Traumatic subdural hemorrhage with loss of consciousness status unknown, initial encounter: Secondary | ICD-10-CM

## 2017-09-18 LAB — CBC
HEMATOCRIT: 38 % (ref 35.0–45.0)
HEMOGLOBIN: 12 g/dL (ref 11.7–15.5)
MCH: 28.5 pg (ref 27.0–33.0)
MCHC: 31.6 g/dL — ABNORMAL LOW (ref 32.0–36.0)
MCV: 90.3 fL (ref 80.0–100.0)
MPV: 12.4 fL (ref 7.5–12.5)
Platelets: 205 10*3/uL (ref 140–400)
RBC: 4.21 10*6/uL (ref 3.80–5.10)
RDW: 12.1 % (ref 11.0–15.0)
WBC: 3 10*3/uL — AB (ref 3.8–10.8)

## 2017-09-18 LAB — COMPREHENSIVE METABOLIC PANEL
ALBUMIN: 3.9 g/dL (ref 3.5–5.2)
ALK PHOS: 44 U/L (ref 39–117)
ALT: 14 U/L (ref 0–35)
AST: 16 U/L (ref 0–37)
BUN: 15 mg/dL (ref 6–23)
CO2: 27 mEq/L (ref 19–32)
CREATININE: 0.76 mg/dL (ref 0.40–1.20)
Calcium: 9 mg/dL (ref 8.4–10.5)
Chloride: 106 mEq/L (ref 96–112)
GFR: 95.35 mL/min (ref >60.00)
Glucose, Bld: 90 mg/dL (ref 70–99)
POTASSIUM: 4 meq/L (ref 3.5–5.1)
SODIUM: 141 meq/L (ref 135–145)
TOTAL PROTEIN: 7.4 g/dL (ref 6.0–8.3)
Total Bilirubin: 0.4 mg/dL (ref 0.2–1.2)

## 2017-09-18 LAB — LIPID PANEL
CHOLESTEROL: 199 mg/dL (ref 0–200)
HDL: 53.5 mg/dL (ref >39.00)
LDL Cholesterol: 131 mg/dL — ABNORMAL HIGH (ref 0–99)
NonHDL: 145.2
Total CHOL/HDL Ratio: 4
Triglycerides: 70 mg/dL (ref 0.0–149.0)
VLDL: 14 mg/dL (ref 0.0–40.0)

## 2017-09-18 MED ORDER — MELOXICAM 7.5 MG PO TABS: 7.5000 mg | ORAL_TABLET | Freq: Every day | ORAL | 1 refills | Status: DC

## 2017-09-18 NOTE — Patient Instructions (Signed)
Good to see you today! You got your 2nd pneumonia vaccine today I would recommend that you get the Shingrix shingles vaccines at your pharmacy at your convenience We will get labs for you today- then please go to the ground floor to have x-rays of your left shoulder.    Then you can go home and I will be in touch with your results asap I refilled your mobic- you can use this as needed for your shoulder and hand pain.  However, try to avoid using this more than a couple of times a week as it can irritate your stomach   Health Maintenance for Postmenopausal Women Menopause is a normal process in which your reproductive ability comes to an end. This process happens gradually over a span of months to years, usually between the ages of 43 and 42. Menopause is complete when you have missed 12 consecutive menstrual periods. It is important to talk with your health care provider about some of the most common conditions that affect postmenopausal women, such as heart disease, cancer, and bone loss (osteoporosis). Adopting a healthy lifestyle and getting preventive care can help to promote your health and wellness. Those actions can also lower your chances of developing some of these common conditions. What should I know about menopause? During menopause, you may experience a number of symptoms, such as:  Moderate-to-severe hot flashes.  Night sweats.  Decrease in sex drive.  Mood swings.  Headaches.  Tiredness.  Irritability.  Memory problems.  Insomnia.  Choosing to treat or not to treat menopausal changes is an individual decision that you make with your health care provider. What should I know about hormone replacement therapy and supplements? Hormone therapy products are effective for treating symptoms that are associated with menopause, such as hot flashes and night sweats. Hormone replacement carries certain risks, especially as you become older. If you are thinking about using  estrogen or estrogen with progestin treatments, discuss the benefits and risks with your health care provider. What should I know about heart disease and stroke? Heart disease, heart attack, and stroke become more likely as you age. This may be due, in part, to the hormonal changes that your body experiences during menopause. These can affect how your body processes dietary fats, triglycerides, and cholesterol. Heart attack and stroke are both medical emergencies. There are many things that you can do to help prevent heart disease and stroke:  Have your blood pressure checked at least every 1-2 years. High blood pressure causes heart disease and increases the risk of stroke.  If you are 41-51 years old, ask your health care provider if you should take aspirin to prevent a heart attack or a stroke.  Do not use any tobacco products, including cigarettes, chewing tobacco, or electronic cigarettes. If you need help quitting, ask your health care provider.  It is important to eat a healthy diet and maintain a healthy weight. ? Be sure to include plenty of vegetables, fruits, low-fat dairy products, and lean protein. ? Avoid eating foods that are high in solid fats, added sugars, or salt (sodium).  Get regular exercise. This is one of the most important things that you can do for your health. ? Try to exercise for at least 150 minutes each week. The type of exercise that you do should increase your heart rate and make you sweat. This is known as moderate-intensity exercise. ? Try to do strengthening exercises at least twice each week. Do these in addition to the  moderate-intensity exercise.  Know your numbers.Ask your health care provider to check your cholesterol and your blood glucose. Continue to have your blood tested as directed by your health care provider.  What should I know about cancer screening? There are several types of cancer. Take the following steps to reduce your risk and to catch  any cancer development as early as possible. Breast Cancer  Practice breast self-awareness. ? This means understanding how your breasts normally appear and feel. ? It also means doing regular breast self-exams. Let your health care provider know about any changes, no matter how small.  If you are 73 or older, have a clinician do a breast exam (clinical breast exam or CBE) every year. Depending on your age, family history, and medical history, it may be recommended that you also have a yearly breast X-ray (mammogram).  If you have a family history of breast cancer, talk with your health care provider about genetic screening.  If you are at high risk for breast cancer, talk with your health care provider about having an MRI and a mammogram every year.  Breast cancer (BRCA) gene test is recommended for women who have family members with BRCA-related cancers. Results of the assessment will determine the need for genetic counseling and BRCA1 and for BRCA2 testing. BRCA-related cancers include these types: ? Breast. This occurs in males or females. ? Ovarian. ? Tubal. This may also be called fallopian tube cancer. ? Cancer of the abdominal or pelvic lining (peritoneal cancer). ? Prostate. ? Pancreatic.  Cervical, Uterine, and Ovarian Cancer Your health care provider may recommend that you be screened regularly for cancer of the pelvic organs. These include your ovaries, uterus, and vagina. This screening involves a pelvic exam, which includes checking for microscopic changes to the surface of your cervix (Pap test).  For women ages 21-65, health care providers may recommend a pelvic exam and a Pap test every three years. For women ages 48-65, they may recommend the Pap test and pelvic exam, combined with testing for human papilloma virus (HPV), every five years. Some types of HPV increase your risk of cervical cancer. Testing for HPV may also be done on women of any age who have unclear Pap test  results.  Other health care providers may not recommend any screening for nonpregnant women who are considered low risk for pelvic cancer and have no symptoms. Ask your health care provider if a screening pelvic exam is right for you.  If you have had past treatment for cervical cancer or a condition that could lead to cancer, you need Pap tests and screening for cancer for at least 20 years after your treatment. If Pap tests have been discontinued for you, your risk factors (such as having a new sexual partner) need to be reassessed to determine if you should start having screenings again. Some women have medical problems that increase the chance of getting cervical cancer. In these cases, your health care provider may recommend that you have screening and Pap tests more often.  If you have a family history of uterine cancer or ovarian cancer, talk with your health care provider about genetic screening.  If you have vaginal bleeding after reaching menopause, tell your health care provider.  There are currently no reliable tests available to screen for ovarian cancer.  Lung Cancer Lung cancer screening is recommended for adults 11-32 years old who are at high risk for lung cancer because of a history of smoking. A yearly low-dose CT  scan of the lungs is recommended if you:  Currently smoke.  Have a history of at least 30 pack-years of smoking and you currently smoke or have quit within the past 15 years. A pack-year is smoking an average of one pack of cigarettes per day for one year.  Yearly screening should:  Continue until it has been 15 years since you quit.  Stop if you develop a health problem that would prevent you from having lung cancer treatment.  Colorectal Cancer  This type of cancer can be detected and can often be prevented.  Routine colorectal cancer screening usually begins at age 29 and continues through age 59.  If you have risk factors for colon cancer, your health  care provider may recommend that you be screened at an earlier age.  If you have a family history of colorectal cancer, talk with your health care provider about genetic screening.  Your health care provider may also recommend using home test kits to check for hidden blood in your stool.  A small camera at the end of a tube can be used to examine your colon directly (sigmoidoscopy or colonoscopy). This is done to check for the earliest forms of colorectal cancer.  Direct examination of the colon should be repeated every 5-10 years until age 49. However, if early forms of precancerous polyps or small growths are found or if you have a family history or genetic risk for colorectal cancer, you may need to be screened more often.  Skin Cancer  Check your skin from head to toe regularly.  Monitor any moles. Be sure to tell your health care provider: ? About any new moles or changes in moles, especially if there is a change in a mole's shape or color. ? If you have a mole that is larger than the size of a pencil eraser.  If any of your family members has a history of skin cancer, especially at a young age, talk with your health care provider about genetic screening.  Always use sunscreen. Apply sunscreen liberally and repeatedly throughout the day.  Whenever you are outside, protect yourself by wearing long sleeves, pants, a wide-brimmed hat, and sunglasses.  What should I know about osteoporosis? Osteoporosis is a condition in which bone destruction happens more quickly than new bone creation. After menopause, you may be at an increased risk for osteoporosis. To help prevent osteoporosis or the bone fractures that can happen because of osteoporosis, the following is recommended:  If you are 75-72 years old, get at least 1,000 mg of calcium and at least 600 mg of vitamin D per day.  If you are older than age 62 but younger than age 72, get at least 1,200 mg of calcium and at least 600 mg of  vitamin D per day.  If you are older than age 77, get at least 1,200 mg of calcium and at least 800 mg of vitamin D per day.  Smoking and excessive alcohol intake increase the risk of osteoporosis. Eat foods that are rich in calcium and vitamin D, and do weight-bearing exercises several times each week as directed by your health care provider. What should I know about how menopause affects my mental health? Depression may occur at any age, but it is more common as you become older. Common symptoms of depression include:  Low or sad mood.  Changes in sleep patterns.  Changes in appetite or eating patterns.  Feeling an overall lack of motivation or enjoyment of activities  that you previously enjoyed.  Frequent crying spells.  Talk with your health care provider if you think that you are experiencing depression. What should I know about immunizations? It is important that you get and maintain your immunizations. These include:  Tetanus, diphtheria, and pertussis (Tdap) booster vaccine.  Influenza every year before the flu season begins.  Pneumonia vaccine.  Shingles vaccine.  Your health care provider may also recommend other immunizations. This information is not intended to replace advice given to you by your health care provider. Make sure you discuss any questions you have with your health care provider. Document Released: 09/16/2005 Document Revised: 02/12/2016 Document Reviewed: 04/28/2015 Elsevier Interactive Patient Education  2018 [employer].

## 2017-09-19 ENCOUNTER — Other Ambulatory Visit: Payer: Self-pay | Admitting: Hematology

## 2017-09-19 ENCOUNTER — Other Ambulatory Visit: Payer: Medicare Other

## 2017-09-19 ENCOUNTER — Ambulatory Visit: Payer: Medicare Other | Admitting: Hematology

## 2017-09-19 DIAGNOSIS — D472 Monoclonal gammopathy: Secondary | ICD-10-CM

## 2017-09-19 DIAGNOSIS — D709 Neutropenia, unspecified: Secondary | ICD-10-CM

## 2017-09-19 NOTE — Progress Notes (Unsigned)
Cbc

## 2017-12-19 ENCOUNTER — Other Ambulatory Visit: Payer: Self-pay | Admitting: Family Medicine

## 2017-12-19 DIAGNOSIS — Z1231 Encounter for screening mammogram for malignant neoplasm of breast: Secondary | ICD-10-CM

## 2017-12-28 DIAGNOSIS — H401131 Primary open-angle glaucoma, bilateral, mild stage: Secondary | ICD-10-CM | POA: Diagnosis not present

## 2017-12-28 DIAGNOSIS — H2513 Age-related nuclear cataract, bilateral: Secondary | ICD-10-CM | POA: Diagnosis not present

## 2018-01-09 ENCOUNTER — Ambulatory Visit
Admission: RE | Admit: 2018-01-09 | Discharge: 2018-01-09 | Disposition: A | Discharge status: Home / Self Care | Payer: Medicare Other | Source: Ambulatory Visit | Attending: Family Medicine | Admitting: Family Medicine

## 2018-01-09 DIAGNOSIS — Z1231 Encounter for screening mammogram for malignant neoplasm of breast: Secondary | ICD-10-CM

## 2018-02-14 IMAGING — DX DG SHOULDER 2+V*L*
3 series · 3 of 3 positions shown · non-contrast
Comparison: None.

CLINICAL DATA: Intermittent left shoulder pain for several years.
No known injury.

EXAM:
LEFT SHOULDER - 2+ VIEW

[shoulder grashey]
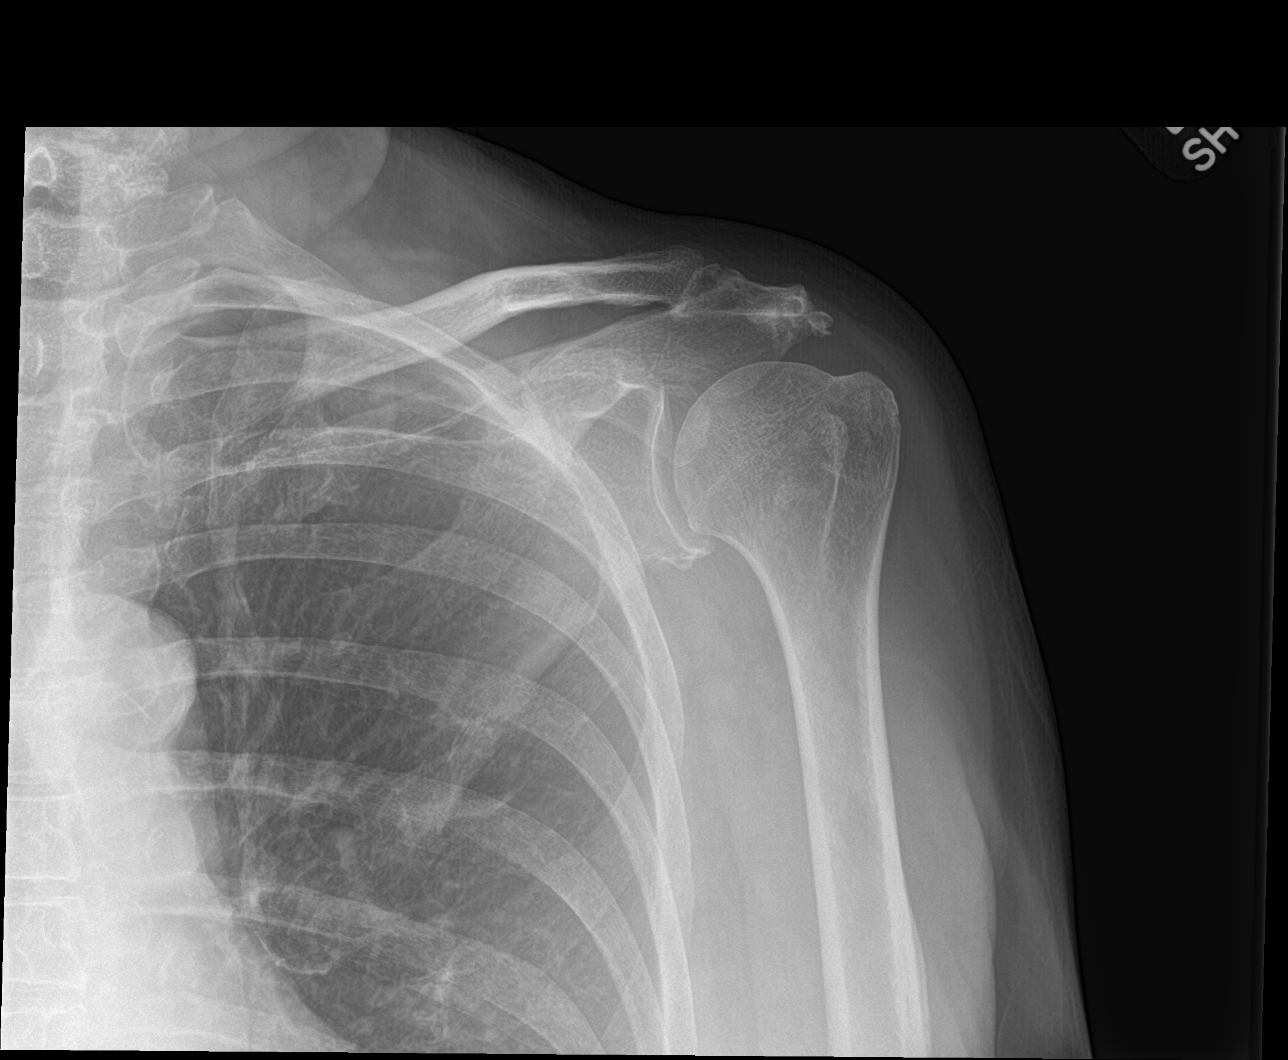

[shoulder y view]
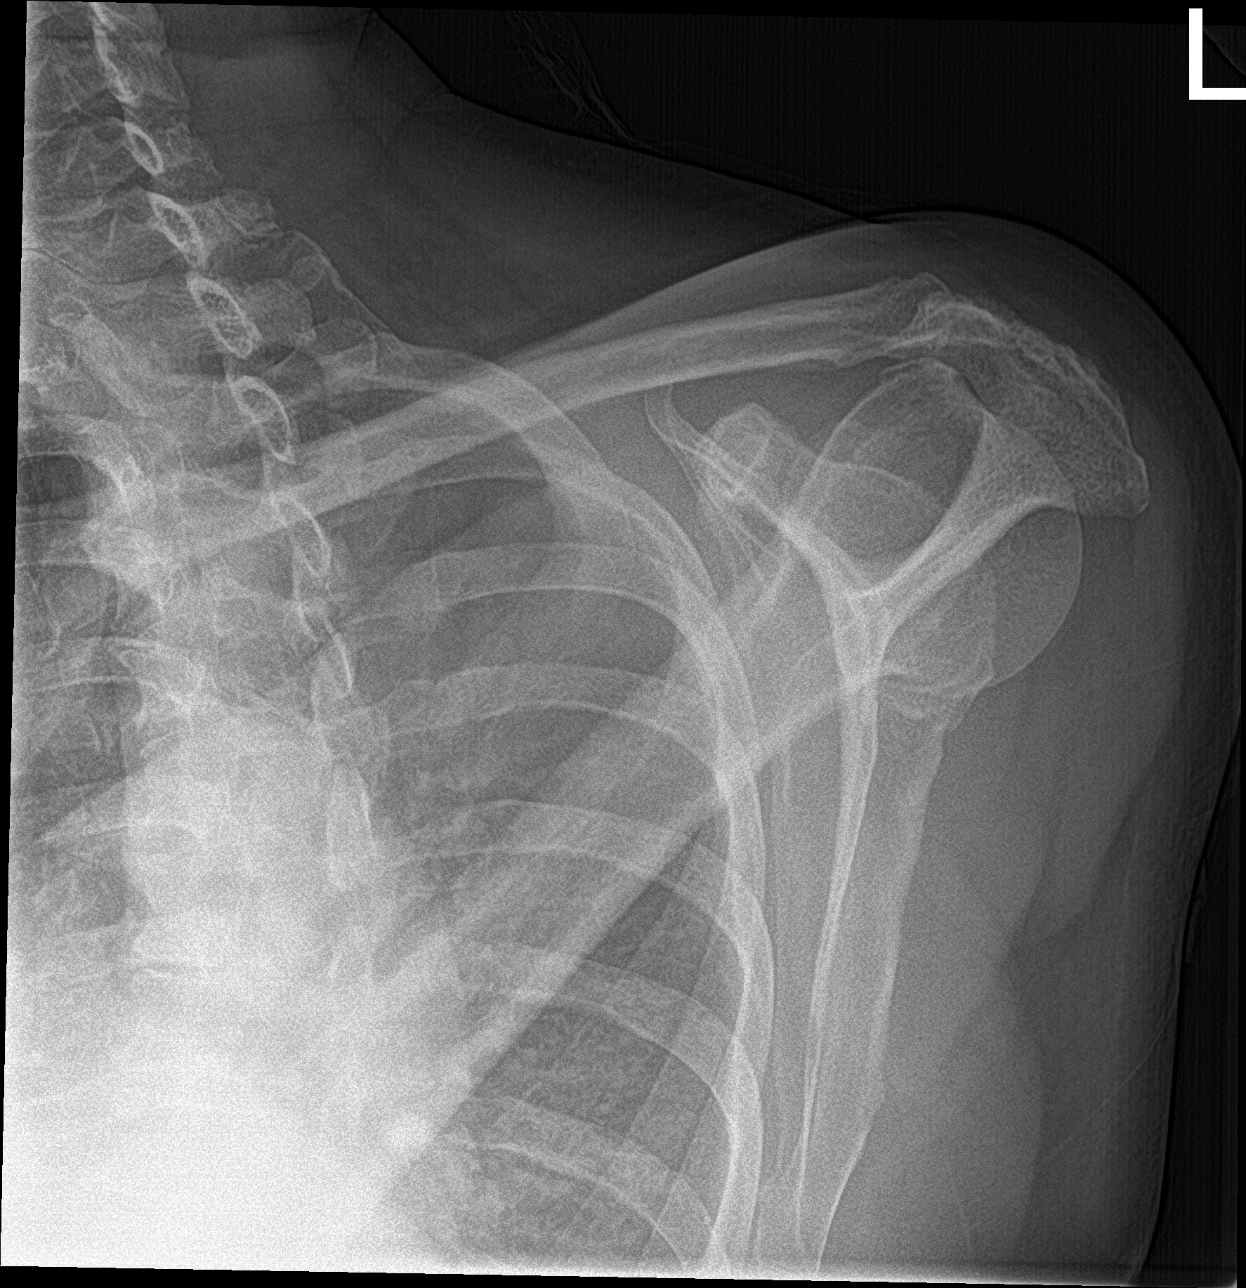

[shoulder axillary]
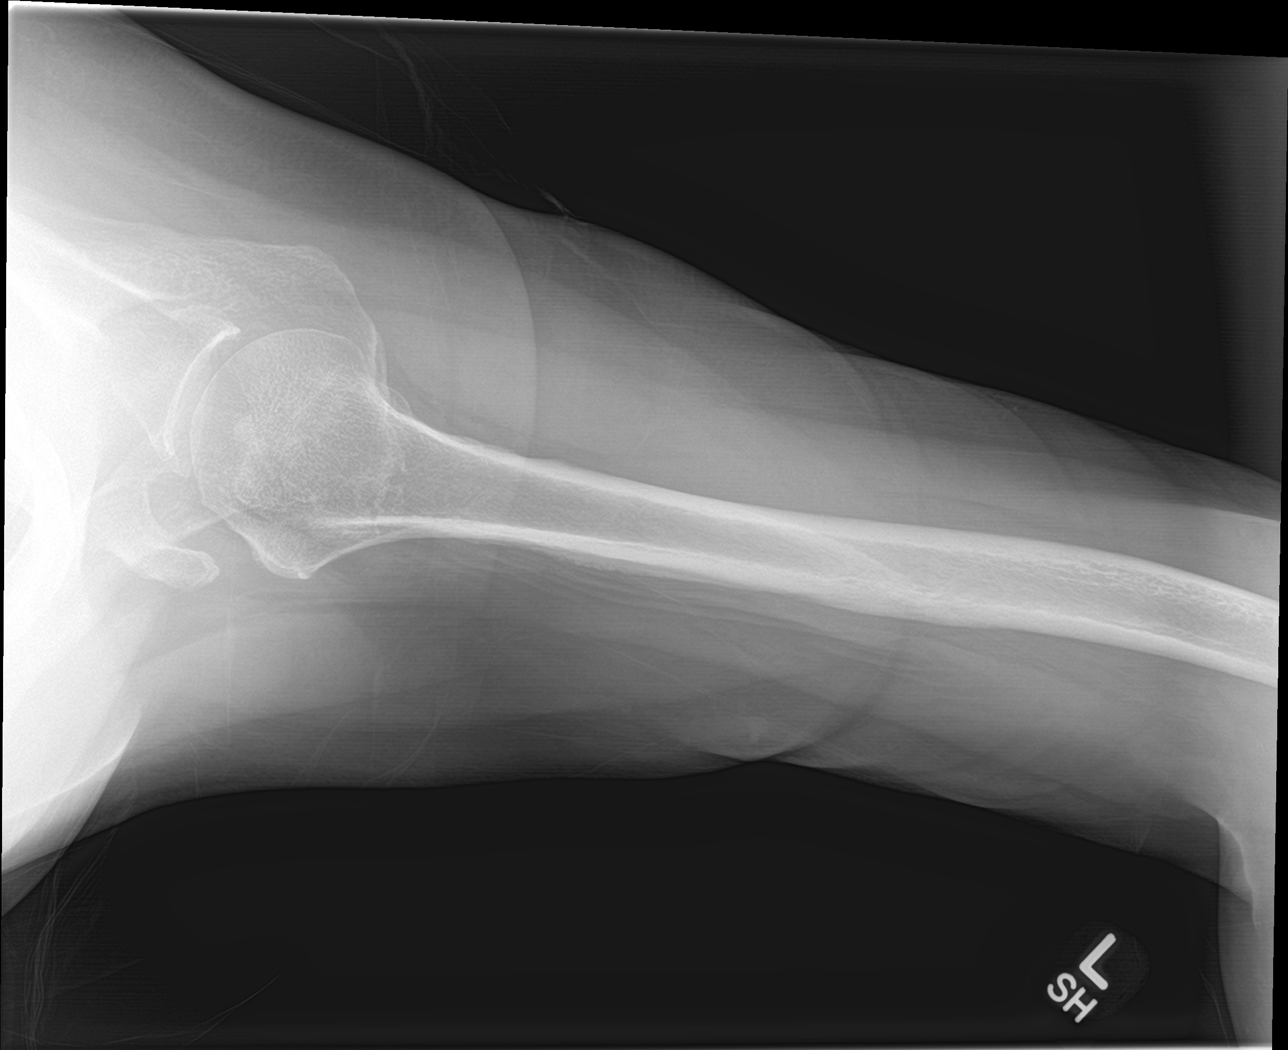

[3 of 3 positions shown; findings below may reference images not displayed]

FINDINGS: No fracture or dislocation. Mild degenerative change of the
glenohumeral joint with joint space loss, subchondral sclerosis and
inferiorly directed osteophytosis.

Mild-to-moderate degenerative change of the left acromioclavicular
joint with joint space loss and inferiorly directed osteophytosis.
Minimal avulsive change involving the tip of the acromion. No
evidence of calcific tendinitis.

Limited visualization of the adjacent thorax is normal. Regional
soft tissues appear normal.
IMPRESSION: Mild to moderate degenerative change of the left acromioclavicular
and glenohumeral joints.

## 2018-05-03 ENCOUNTER — Ambulatory Visit (HOSPITAL_BASED_OUTPATIENT_CLINIC_OR_DEPARTMENT_OTHER)
Admission: RE | Admit: 2018-05-03 | Discharge: 2018-05-03 | Disposition: A | Discharge status: Home / Self Care | Payer: Medicare Other | Source: Ambulatory Visit | Attending: Medical | Admitting: Medical

## 2018-05-03 ENCOUNTER — Ambulatory Visit: Payer: Self-pay

## 2018-05-03 ENCOUNTER — Encounter: Payer: Self-pay | Admitting: Medical

## 2018-05-03 ENCOUNTER — Ambulatory Visit (INDEPENDENT_AMBULATORY_CARE_PROVIDER_SITE_OTHER): Payer: Medicare Other | Admitting: Medical

## 2018-05-03 VITALS — BP 143/63 | HR 70 | Temp 98.7°F | Resp 16 | Ht 67.0 in | Wt 189.2 lb

## 2018-05-03 DIAGNOSIS — R29818 Other symptoms and signs involving the nervous system: Secondary | ICD-10-CM | POA: Insufficient documentation

## 2018-05-03 DIAGNOSIS — S0003XA Contusion of scalp, initial encounter: Secondary | ICD-10-CM

## 2018-05-03 DIAGNOSIS — H814 Vertigo of central origin: Secondary | ICD-10-CM

## 2018-05-03 DIAGNOSIS — R42 Dizziness and giddiness: Secondary | ICD-10-CM | POA: Diagnosis not present

## 2018-05-03 DIAGNOSIS — H8149 Vertigo of central origin, unspecified ear: Secondary | ICD-10-CM

## 2018-05-03 MED ORDER — MECLIZINE HCL 12.5 MG PO TABS: 12.5000 mg | ORAL_TABLET | Freq: Three times a day (TID) | ORAL | 0 refills | Status: DC | PRN

## 2018-05-03 NOTE — Progress Notes (Signed)
Subjective:    Patient ID: Tara Jimenez, female    DOB: 1940-12-29, 77 y.o.   MRN: [id number]  HPI  Pt in for dizziness on and off for past 10 days.  No obvious gross motor or sensory function deficits initially. She states right before this she bumped her head on door latch after bending over. State bumped her frontal region but no immediate neuro type symptoms. Dizziness started 2 days after. Small laceration on scalp that healed quickly.   Pt not on blood thinners other than aspirin.  Pt does have hx of subdurmal hematoma many years ago in 2013.   Review of Systems  Constitutional: Negative for chills and fever.  HENT: Negative for congestion.   Respiratory: Negative for cough, chest tightness, shortness of breath and wheezing.   Cardiovascular: Negative for chest pain and palpitations.  Gastrointestinal: Negative for abdominal pain, rectal pain and vomiting.  Genitourinary: Negative for difficulty urinating, dyspareunia, dysuria and frequency.  Musculoskeletal: Negative for back pain.  Skin: Negative for rash.  Neurological: Positive for dizziness and light-headedness. Negative for seizures, syncope, speech difficulty, weakness and numbness.       States more light headed than dizzy per pt.  In past she states if has dizziness was self limited and would resolve in a day. Would  go away with medication.    Psychiatric/Behavioral: Negative for behavioral problems. The patient is not nervous/anxious.     Past Medical History:  Diagnosis Date  . Arthritis   . Glaucoma      Social History   Socioeconomic History  . Marital status: Single    Spouse name: Not on file  . Number of children: Not on file  . Years of education: Not on file  . Highest education level: Not on file  Occupational History  . Not on file  Social Needs  . Financial resource strain: Not on file  . Food insecurity:    Worry: Not on file    Inability: Not on file  . Transportation needs:   Medical: Not on file    Non-medical: Not on file  Tobacco Use  . Smoking status: Never Smoker  . Smokeless tobacco: Never Used  Substance and Sexual Activity  . Alcohol use: No    Alcohol/week: 0.0 standard drinks  . Drug use: No  . Sexual activity: Not on file  Lifestyle  . Physical activity:    Days per week: Not on file    Minutes per session: Not on file  . Stress: Not on file  Relationships  . Social connections:    Talks on phone: Not on file    Gets together: Not on file    Attends religious service: Not on file    Active member of club or organization: Not on file    Attends meetings of clubs or organizations: Not on file    Relationship status: Not on file  . Intimate partner violence:    Fear of current or ex partner: Not on file    Emotionally abused: Not on file    Physically abused: Not on file    Forced sexual activity: Not on file  Other Topics Concern  . Not on file  Social History Narrative  . Not on file    Past Surgical History:  Procedure Laterality Date  . BRAIN SURGERY      Family History  Problem Relation Age of Onset  . Diabetes Brother   . Heart disease Brother   . Hypertension  Brother   . Aneurysm Brother   . Breast cancer Neg Hx     No Known Allergies  Current Outpatient Medications on File Prior to Visit  Medication Sig Dispense Refill  . aspirin EC 81 MG tablet Take 81 mg by mouth daily.    Marland Kitchen latanoprost (XALATAN) 0.005 % ophthalmic solution Place 1 drop into both eyes at bedtime.     . meloxicam (MOBIC) 7.5 MG tablet Take 1 tablet (7.5 mg total) by mouth daily. Use as needed for shoulder and hand pain 30 tablet 1   No current facility-administered medications on file prior to visit.     BP (!) 143/63   Pulse 70   Temp 98.7 F (37.1 C) (Oral)   Resp 16   Ht 5\' 7"  (1.702 m)   Wt 189 lb 3.2 oz (85.8 kg)   SpO2 99%   BMI 29.63 kg/m       Objective:   Physical Exam  General Mental Status- Alert. General Appearance-  Not in acute distress.   Skin General: Color- Normal Color. Moisture- Normal Moisture. Small laceration scar on front of her scalp that is healed already.  Neck Carotid Arteries- Normal color. Moisture- Normal Moisture. No carotid bruits. No JVD.  Chest and Lung Exam Auscultation: Breath Sounds:-Normal.  Cardiovascular Auscultation:Rythm- Regular. Murmurs & Other Heart Sounds:Auscultation of the heart reveals- No Murmurs.  Abdomen Inspection:-Inspeection Normal. Palpation/Percussion:Note:No mass. Palpation and Percussion of the abdomen reveal- Non Tender, Non Distended + BS, no rebound or guarding.   Neurologic Cranial Nerve exam:- CN III-XII intact(No nystagmus), symmetric smile. Drift Test:- No drift. Romberg Exam:- Negative.  Heal to Toe Gait exam:-poor heel to toe gait.Slight difficulty. Finger to Nose:- Normal/Intact Strength:- 5/5 equal and symmetric strength both upper and lower extremities.      Assessment & Plan:   [phone].   You describe recent intermittent dizziness/being off balance 2 days after mild head contusion(10 days ago) which resulted in slight scalp laceration.  On inspection that wound is already healed.  However I do think it is a good idea to go ahead and get a CBC, CMP and CT of the head without contrast.  With blood work we can assess if there is any factors that might predispose you to being dizzy.  With imaging study we can evaluate you for any injury you could have suffered with head contusion/injury.  On review you did have history of subdural hematoma years ago following some trauma so will follow CT of head closely.  I do want you to keep in mind that CT imaging is just a preliminary study.  You overall have very good neurologic exam.  However if the CT of the head is negative but you have worsening signs or symptoms as we discussed then you would need to be seen in the emergency department at [facility] for MRI imaging.  Meclizine  provided and would recommend that she use that 1 tablet every 8 hours for the next 24 hours.  After that we just recommend using meclizine on as needed basis.  Follow-up in 7 to 10 days with PCP or as needed.  40 minutes spent with paitent today. 50% of time counseled on work up and differential diagnosis for dizziness, process going forward and treatment.  Mackie Pai, PA-C

## 2018-05-03 NOTE — Patient Instructions (Addendum)
You describe recent intermittent dizziness/being off balance 2 days after mild head contusion(10 days ago) which resulted in slight scalp laceration.  On inspection that wound is already healed.  However I do think it is a good idea to go ahead and get a CBC, CMP and CT of the head without contrast.  With blood work we can assess if there is any factors that might predispose you to being dizzy.  With imaging study we can evaluate you for any injury you could have suffered with head contusion/injury.  On review you did have history of subdural hematoma years ago following some trauma so will follow CT of head closely.  I do want you to keep in mind that CT imaging is just a preliminary study.  You overall have very good neurologic exam.  However if the CT of the head is negative but you have worsening signs or symptoms as we discussed then you would need to be seen in the emergency department at [facility] for MRI imaging.  Meclizine provided and would recommend that she use that 1 tablet every 8 hours for the next 24 hours.  After that we just recommend using meclizine on as needed basis.  Follow-up in 7 to 10 days with PCP or as needed.

## 2018-05-03 NOTE — Telephone Encounter (Signed)
Called pt who had C/O dizziness x 10 days. Pt rates dizziness as mild. She is able to do normal activities but dizziness is worrisome. Pt states she has had surgery to her head in the past for a blood clot and says the dizziness began about the time she hit her head on the door turning around in her home.  She said she hit the top front of her head.   Pt denies nausea, headache. Appointment made per protocol.  Care advice given.  Pt verbalized understanding of all instructions.  Reason for Disposition . [1] MILD dizziness (e.g., walking normally) AND [2] has NOT been evaluated by physician for this  (Exception: dizziness caused by heat exposure, sudden standing, or poor fluid intake)    Hx of bumping top of head on door when turning around  Answer Assessment - Initial Assessment Questions 1. DESCRIPTION: "Describe your dizziness."     lightheaded 2. LIGHTHEADED: "Do you feel lightheaded?" (e.g., somewhat faint, woozy, weak upon standing)     Lightheaded not steady 3. VERTIGO: "Do you feel like either you or the room is spinning or tilting?" (i.e. vertigo)     no 4. SEVERITY: "How bad is it?"  "Do you feel like you are going to faint?" "Can you stand and walk?"   - MILD - walking normally   - MODERATE - interferes with normal activities (e.g., work, school)    - SEVERE - unable to stand, requires support to walk, feels like passing out now.      mild 5. ONSET:  "When did the dizziness begin?"     Week to 10 day ago after a bump to her head 6. AGGRAVATING FACTORS: "Does anything make it worse?" (e.g., standing, change in head position)     no 7. HEART RATE: "Can you tell me your heart rate?" "How many beats in 15 seconds?"  (Note: not all patients can do this)       no 8. CAUSE: "What do you think is causing the dizziness?"     Bump to head dizziness started after 9. RECURRENT SYMPTOM: "Have you had dizziness before?" If so, ask: "When was the last time?" "What happened that time?"      no 10. OTHER SYMPTOMS: "Do you have any other symptoms?" (e.g., fever, chest pain, vomiting, diarrhea, bleeding)       no 11. PREGNANCY: "Is there any chance you are pregnant?" "When was your last menstrual period?"       N/A  Protocols used: DIZZINESS [facility]

## 2018-05-04 LAB — COMPREHENSIVE METABOLIC PANEL
ALBUMIN: 3.8 g/dL (ref 3.5–5.2)
ALT: 12 U/L (ref 0–35)
AST: 13 U/L (ref 0–37)
Alkaline Phosphatase: 52 U/L (ref 39–117)
BUN: 14 mg/dL (ref 6–23)
CALCIUM: 8.9 mg/dL (ref 8.4–10.5)
CHLORIDE: 108 meq/L (ref 96–112)
CO2: 27 meq/L (ref 19–32)
Creatinine, Ser: 0.86 mg/dL (ref 0.40–1.20)
GFR: 82.53 mL/min (ref >60.00)
Glucose, Bld: 84 mg/dL (ref 70–99)
POTASSIUM: 4.1 meq/L (ref 3.5–5.1)
SODIUM: 142 meq/L (ref 135–145)
Total Bilirubin: 0.3 mg/dL (ref 0.2–1.2)
Total Protein: 6.9 g/dL (ref 6.0–8.3)

## 2018-05-04 LAB — CBC WITH DIFFERENTIAL/PLATELET
BASOS PCT: 0.7 % (ref 0.0–3.0)
Basophils Absolute: 0 10*3/uL (ref 0.0–0.1)
EOS ABS: 0 10*3/uL (ref 0.0–0.7)
EOS PCT: 1.4 % (ref 0.0–5.0)
HEMATOCRIT: 35.4 % — AB (ref 36.0–46.0)
HEMOGLOBIN: 11.5 g/dL — AB (ref 12.0–15.0)
Lymphocytes Relative: 31.5 % (ref 12.0–46.0)
Lymphs Abs: 1.1 10*3/uL (ref 0.7–4.0)
MCHC: 32.5 g/dL (ref 30.0–36.0)
MCV: 92 fl (ref 78.0–100.0)
MONO ABS: 0.3 10*3/uL (ref 0.1–1.0)
Monocytes Relative: 7 % (ref 3.0–12.0)
NEUTROS ABS: 2.1 10*3/uL (ref 1.4–7.7)
Neutrophils Relative %: 59.4 % (ref 43.0–77.0)
PLATELETS: 192 10*3/uL (ref 150.0–400.0)
RBC: 3.85 Mil/uL — ABNORMAL LOW (ref 3.87–5.11)
RDW: 13.5 % (ref 11.5–15.5)
WBC: 3.6 10*3/uL — AB (ref 4.0–10.5)

## 2018-06-07 ENCOUNTER — Ambulatory Visit: Payer: Self-pay

## 2018-06-07 NOTE — Telephone Encounter (Signed)
Phone call returned to pt.  Reported she has had a "burning pain" in left lateral chest, below the breast, for the past week. Described that "on the side of left chest, where the left breast ends, the pain begins."  Stated the pain comes and goes, and can last 30 min. to 2 hrs.  Reported she has applied cold compress to area for relief.  Reported has taken Tylenol, and unsure if this has really helped.  Denied any radiation of pain, nausea/ vomiting, fever/chills, shortness of breath, or dizziness.  Denied any pain with deep breath.  Denied any rash or change in skin color.  Reported there is "a little swelling", compared to the right side.  Denied any discomfort at present time.  Reported had one episode this AM that lasted approx. 30 min.    Appt. has been sched. for 06/08/18 with PA. Care advice given per protocol.  Verb. Understanding.  Strongly encouraged to go to the ER with worsening symptoms.  Verb. Understanding.   (Due to difficulty understanding location of pain, initially, with triage assess., followed Abd. Pain protocol, and then changed to Chest Pain protocol.)        Reason for Disposition . [1] Chest pain lasts > 5 minutes AND [2] occurred > 3 days ago (72 hours) AND [3] NO chest pain or cardiac symptoms now    C/o left lateral chest pain ; described on side of chest, below the breast; "where the breast ends, is where the pain begins".  Denied radiation of pain.  Varies in duration; lasts 30 min. to 2 hrs.  Describes as a "burning".  Answer Assessment - Initial Assessment Questions 1. LOCATION: "Where does it hurt?"      Left upper abdominal pain, and below breast to the side 2. RADIATION: "Does the pain shoot anywhere else?" (e.g., chest, back)     Denied 3. ONSET: "When did the pain begin?" (e.g., minutes, hours or days ago)      About one week ago  4. SUDDEN: "Gradual or sudden onset?"     sudden 5. PATTERN "Does the pain come and go, or is it constant?"    - If constant: "Is it  getting better, staying the same, or worsening?"      (Note: Constant means the pain never goes away completely; most serious pain is constant and it progresses)     - If intermittent: "How long does it last?" "Do you have pain now?"     (Note: Intermittent means the pain goes away completely between bouts)     Comes and goes 6. SEVERITY: "How bad is the pain?"  (e.g., Scale 1-10; mild, moderate, or severe)   - MILD (1-3): doesn't interfere with normal activities, abdomen soft and not tender to touch    - MODERATE (4-7): interferes with normal activities or awakens from sleep, tender to touch    - SEVERE (8-10): excruciating pain, doubled over, unable to do any normal activities      8/10 intermittent 7. RECURRENT SYMPTOM: "Have you ever had this type of abdominal pain before?" If so, ask: "When was the last time?" and "What happened that time?"      Denied  8. CAUSE: "What do you think is causing the abdominal pain?"    unknown 9. RELIEVING/AGGRAVATING FACTORS: "What makes it better or worse?" (e.g., movement, antacids, bowel movement)    Cold compress makes it better 10. OTHER SYMPTOMS: "Has there been any vomiting, diarrhea, constipation, or urine problems?"  Denied abdominal tenderness, abdominal bloating, nausea/ vomiting, or fever/ chills  11. PREGNANCY: "Is there any chance you are pregnant?" "When was your last menstrual period?"      N/a  Answer Assessment - Initial Assessment Questions 1. LOCATION: "Where does it hurt?"       Left lateral chest; non-radiating 2. RADIATION: "Does the pain go anywhere else?" (e.g., into neck, jaw, arms, back)    Denied  3. ONSET: "When did the chest pain begin?" (Minutes, hours or days)      About one week ago  4. PATTERN "Does the pain come and go, or has it been constant since it started?"  "Does it get worse with exertion?"      Comes and goes  5. DURATION: "How long does it last" (e.g., seconds, minutes, hours)     A couple of  hours 6. SEVERITY: "How bad is the pain?"  (e.g., Scale 1-10; mild, moderate, or severe)    - MILD (1-3): doesn't interfere with normal activities     - MODERATE (4-7): interferes with normal activities or awakens from sleep    - SEVERE (8-10): excruciating pain, unable to do any normal activities       Denied pain at present; sometimes 8/10 7. CARDIAC RISK FACTORS: "Do you have any history of heart problems or risk factors for heart disease?" (e.g., prior heart attack, angina; high blood pressure, diabetes, being overweight, high cholesterol, smoking, or strong family history of heart disease)     Denied DM, HTN, elev. Cholesterol, nonsmoker; is overweight; no hx of cardiac disease 8. PULMONARY RISK FACTORS: "Do you have any history of lung disease?"  (e.g., blood clots in lung, asthma, emphysema, birth control pills)     Denied PE 9. CAUSE: "What do you think is causing the chest pain?"     Unknown  10. OTHER SYMPTOMS: "Do you have any other symptoms?" (e.g., dizziness, nausea, vomiting, sweating, fever, difficulty breathing, cough)       Denied nausea, vomiting, sweating , dizziness, or shortness of breath. 11. PREGNANCY: "Is there any chance you are pregnant?" "When was your last menstrual period?"       N/a  Protocols used: CHEST PAIN-A-AH, ABDOMINAL PAIN - [facility]

## 2018-06-08 ENCOUNTER — Ambulatory Visit (HOSPITAL_BASED_OUTPATIENT_CLINIC_OR_DEPARTMENT_OTHER)
Admission: RE | Admit: 2018-06-08 | Discharge: 2018-06-08 | Disposition: A | Discharge status: Home / Self Care | Payer: Medicare Other | Source: Ambulatory Visit | Attending: Medical | Admitting: Medical

## 2018-06-08 ENCOUNTER — Encounter: Payer: Self-pay | Admitting: Medical

## 2018-06-08 ENCOUNTER — Ambulatory Visit: Payer: Medicare Other | Admitting: Medical

## 2018-06-08 ENCOUNTER — Ambulatory Visit (INDEPENDENT_AMBULATORY_CARE_PROVIDER_SITE_OTHER): Payer: Medicare Other | Admitting: Medical

## 2018-06-08 VITALS — BP 156/63 | HR 64 | Temp 98.1°F | Resp 16 | Ht 67.0 in | Wt 190.4 lb

## 2018-06-08 DIAGNOSIS — R0781 Pleurodynia: Secondary | ICD-10-CM | POA: Insufficient documentation

## 2018-06-08 DIAGNOSIS — M792 Neuralgia and neuritis, unspecified: Secondary | ICD-10-CM

## 2018-06-08 DIAGNOSIS — B029 Zoster without complications: Secondary | ICD-10-CM

## 2018-06-08 DIAGNOSIS — Z23 Encounter for immunization: Secondary | ICD-10-CM | POA: Diagnosis not present

## 2018-06-08 MED ORDER — FAMCICLOVIR 500 MG PO TABS: 500.0000 mg | ORAL_TABLET | Freq: Three times a day (TID) | ORAL | 0 refills | Status: DC

## 2018-06-08 MED ORDER — GABAPENTIN 100 MG PO CAPS: 100.0000 mg | ORAL_CAPSULE | Freq: Three times a day (TID) | ORAL | 3 refills | Status: DC

## 2018-06-08 NOTE — Progress Notes (Signed)
Subjective:    Patient ID: Tara Jimenez, female    DOB: 1940-12-25, 77 y.o.   MRN: [id number]  HPI  Pt in with some left side abdomen pain for couple of 2 weeks. Pain every day. Pain will about an hour and occur multiple times a day. She states pain is a burning sensation. No rash on skin.  Pain is increasing slightly just recently(more burning). Pain can occur lying down. Pain not associated with movement. Can have pain at rest.  She notes cold compresses seems to stop the burning pain when has   Review of Systems  Constitutional: Negative for chills, fatigue and fever.  Respiratory: Negative for cough, chest tightness, shortness of breath and wheezing.   Cardiovascular: Negative for chest pain and palpitations.  Gastrointestinal: Negative for abdominal distention, abdominal pain, constipation, diarrhea and nausea.  Musculoskeletal: Negative for back pain.  Skin: Negative for rash.  Neurological:       Neuropathic burn pain left lower rib area.     Past Medical History:  Diagnosis Date  . Arthritis   . Glaucoma      Social History   Socioeconomic History  . Marital status: Single    Spouse name: Not on file  . Number of children: Not on file  . Years of education: Not on file  . Highest education level: Not on file  Occupational History  . Not on file  Social Needs  . Financial resource strain: Not on file  . Food insecurity:    Worry: Not on file    Inability: Not on file  . Transportation needs:    Medical: Not on file    Non-medical: Not on file  Tobacco Use  . Smoking status: Never Smoker  . Smokeless tobacco: Never Used  Substance and Sexual Activity  . Alcohol use: No    Alcohol/week: 0.0 standard drinks  . Drug use: No  . Sexual activity: Not on file  Lifestyle  . Physical activity:    Days per week: Not on file    Minutes per session: Not on file  . Stress: Not on file  Relationships  . Social connections:    Talks on phone: Not on file   Gets together: Not on file    Attends religious service: Not on file    Active member of club or organization: Not on file    Attends meetings of clubs or organizations: Not on file    Relationship status: Not on file  . Intimate partner violence:    Fear of current or ex partner: Not on file    Emotionally abused: Not on file    Physically abused: Not on file    Forced sexual activity: Not on file  Other Topics Concern  . Not on file  Social History Narrative  . Not on file    Past Surgical History:  Procedure Laterality Date  . BRAIN SURGERY      Family History  Problem Relation Age of Onset  . Diabetes Brother   . Heart disease Brother   . Hypertension Brother   . Aneurysm Brother   . Breast cancer Neg Hx     No Known Allergies  Current Outpatient Medications on File Prior to Visit  Medication Sig Dispense Refill  . aspirin EC 81 MG tablet Take 81 mg by mouth daily.    Marland Kitchen latanoprost (XALATAN) 0.005 % ophthalmic solution Place 1 drop into both eyes at bedtime.     . meclizine (ANTIVERT)  12.5 MG tablet Take 1 tablet (12.5 mg total) by mouth 3 (three) times daily as needed for dizziness. 30 tablet 0  . meloxicam (MOBIC) 7.5 MG tablet Take 1 tablet (7.5 mg total) by mouth daily. Use as needed for shoulder and hand pain 30 tablet 1   No current facility-administered medications on file prior to visit.     BP (!) 156/63   Pulse 64   Temp 98.1 F (36.7 C) (Oral)   Resp 16   Ht 5\' 7"  (1.702 m)   Wt 190 lb 6.4 oz (86.4 kg)   SpO2 100%   BMI 29.82 kg/m       Objective:   Physical Exam  General- No acute distress. Pleasant patient. Neck- Full range of motion, no jvd Lungs- Clear, even and unlabored. Heart- regular rate and rhythm. Neurologic- CNII- XII grossly intact.  Abdomen- soft, nontender, non distended. +bs. No rebound or guarding no organoagly.  Left side rib- lower/rib region midaxillary region below breast has sensitive are to touch. But no warmth,  no induration. No rash or vesicles.      Assessment & Plan:  You do appear to have left-sided neuropathic pain over the left lower rib area.  Characteristics of pain make me consider most likely diagnosis early shingles prior to rash/vesicular eruption.  Best to treat shingles early as possible.  We will go ahead and prescribe Famvir 1 tablet 3 times daily for the next 7 days.  In addition since the area of pain is directly lying over rib area will get left rib series with a chest x-ray.  If you get any color changes to the skin or warmth to the skin and that me know as that could indicate a bacterial skin infection.  We will go ahead and prescribe gabapentin as you report that the pain seems to be increasing and described as burning.  Gabapentin instruction will be a 1 tablet at night for the first week.  Then on second week 1 tablet twice daily.  On week three 1tablet 3 times daily.  Sedation is side effect so want to start this medication slowly.  Follow-up in 7 days or as needed.  Mackie Pai, PA-C

## 2018-06-08 NOTE — Patient Instructions (Signed)
You do appear to have left-sided neuropathic pain over the left lower rib area.  Characteristics of pain make me consider most likely diagnosis early shingles prior to rash/vesicular eruption.  Best to treat shingles early as possible.  We will go ahead and prescribe Famvir 1 tablet 3 times daily for the next 7 days.  In addition since the area of pain is directly lying over rib area will get left rib series with a chest x-ray.  If you get any color changes to the skin or warmth to the skin and that me know as that could indicate a bacterial skin infection.  We will go ahead and prescribe gabapentin as you report that the pain seems to be increasing and described as burning.  Gabapentin instruction will be a 1 tablet at night for the first week.  Then on second week 1 tablet twice daily.  On week three 1tablet 3 times daily.  Sedation is side effect so want to start this medication slowly.  Follow-up in 7 days or as needed.

## 2018-06-15 ENCOUNTER — Telehealth: Payer: Self-pay

## 2018-06-15 ENCOUNTER — Encounter: Payer: Self-pay | Admitting: Medical

## 2018-06-15 ENCOUNTER — Ambulatory Visit (INDEPENDENT_AMBULATORY_CARE_PROVIDER_SITE_OTHER): Payer: Medicare Other | Admitting: Medical

## 2018-06-15 VITALS — BP 122/59 | HR 70 | Temp 98.3°F | Resp 16 | Ht 67.0 in | Wt 188.8 lb

## 2018-06-15 DIAGNOSIS — M542 Cervicalgia: Secondary | ICD-10-CM | POA: Diagnosis not present

## 2018-06-15 DIAGNOSIS — M792 Neuralgia and neuritis, unspecified: Secondary | ICD-10-CM

## 2018-06-15 DIAGNOSIS — B029 Zoster without complications: Secondary | ICD-10-CM

## 2018-06-15 MED ORDER — CYCLOBENZAPRINE HCL 5 MG PO TABS: 5.0000 mg | ORAL_TABLET | Freq: Every day | ORAL | 0 refills | Status: DC

## 2018-06-15 MED ORDER — FAMCICLOVIR 500 MG PO TABS: 500.0000 mg | ORAL_TABLET | Freq: Three times a day (TID) | ORAL | 0 refills | Status: DC

## 2018-06-15 NOTE — Patient Instructions (Addendum)
You do appear to have some intermittent persistent shingles/nerve type pain.  I do think you have improved some incrementally and would recommend 3 additional days of Famvir and over the next week advance to gabapentin twice daily.  Then give [place] an update in 1 week as to the level of pain and if any rash type breakout has occurred.  You had brief transient bilateral numbness of both hands last night.  No current mid cervical pain and negative testing in office today for carpal tunnel syndrome.  If the numbness returns please let me know and in that event might recommend a wrist cock-up splint and low-dose anti-inflammatory.  Some recent mild reflux symptoms.  Recommend using either Zantac over-the-counter or Tagamet.  You could go downstairs and ask pharmacist regarding their supply Zantac and if that is under recall.  Regarding transient neck pain last night but none presently, I am making Flexeril the lowest dose available to use only at night if you have recurrent neck pain with tightness.  If neck pain does return you could also get cervical spine x-ray.  Future order placed.  Advised and given regarding Flexeril.  Follow-up in 1 week or as needed.

## 2018-06-15 NOTE — Telephone Encounter (Signed)
PA approved.

## 2018-06-15 NOTE — Telephone Encounter (Signed)
PA initiated via Covermymeds; KEY: [id number]. Awaiting determination.

## 2018-06-15 NOTE — Progress Notes (Signed)
Subjective:    Patient ID: Tara Jimenez, female    DOB: Nov 30, 1940, 77 y.o.   MRN: [id number]  HPI  Pt in for follow up.  Pt states she did get famvir. She never got rash as explained might happen but I did go ahead and treat for early shingles based on pain described and distribution. She still has the described burning pain but less intense and less frequent. She has been taking gabapentin only one tab at night.   Pt xray was negative for rib finding and no pneumonia.  Pain in left lower rib/beneath breast burning sensation comes and goes.   Review of Systems  Constitutional: Negative for chills, fatigue and fever.  Respiratory: Negative for cough, choking, shortness of breath and wheezing.   Cardiovascular: Negative for chest pain and palpitations.  Gastrointestinal: Positive for abdominal pain. Negative for abdominal distention and constipation.       Pt states some heart burn recently. Not present now. She takes ranitidine.  Musculoskeletal: Negative for back pain.       Neck pain last night for one hour.  Skin: Negative for pallor and rash.  Neurological: Negative for dizziness, syncope, weakness and light-headedness.       Brief tingling to hands last night. Now resolved.   Hematological: Negative for adenopathy. Does not bruise/bleed easily.  Psychiatric/Behavioral: Negative for behavioral problems, confusion, self-injury and suicidal ideas.    Past Medical History:  Diagnosis Date  . Arthritis   . Glaucoma      Social History   Socioeconomic History  . Marital status: Single    Spouse name: Not on file  . Number of children: Not on file  . Years of education: Not on file  . Highest education level: Not on file  Occupational History  . Not on file  Social Needs  . Financial resource strain: Not on file  . Food insecurity:    Worry: Not on file    Inability: Not on file  . Transportation needs:    Medical: Not on file    Non-medical: Not on file    Tobacco Use  . Smoking status: Never Smoker  . Smokeless tobacco: Never Used  Substance and Sexual Activity  . Alcohol use: No    Alcohol/week: 0.0 standard drinks  . Drug use: No  . Sexual activity: Not on file  Lifestyle  . Physical activity:    Days per week: Not on file    Minutes per session: Not on file  . Stress: Not on file  Relationships  . Social connections:    Talks on phone: Not on file    Gets together: Not on file    Attends religious service: Not on file    Active member of club or organization: Not on file    Attends meetings of clubs or organizations: Not on file    Relationship status: Not on file  . Intimate partner violence:    Fear of current or ex partner: Not on file    Emotionally abused: Not on file    Physically abused: Not on file    Forced sexual activity: Not on file  Other Topics Concern  . Not on file  Social History Narrative  . Not on file    Past Surgical History:  Procedure Laterality Date  . BRAIN SURGERY      Family History  Problem Relation Age of Onset  . Diabetes Brother   . Heart disease Brother   . Hypertension  Brother   . Aneurysm Brother   . Breast cancer Neg Hx     No Known Allergies  Current Outpatient Medications on File Prior to Visit  Medication Sig Dispense Refill  . aspirin EC 81 MG tablet Take 81 mg by mouth daily.    . famciclovir (FAMVIR) 500 MG tablet Take 1 tablet (500 mg total) by mouth 3 (three) times daily. 21 tablet 0  . gabapentin (NEURONTIN) 100 MG capsule Take 1 capsule (100 mg total) by mouth 3 (three) times daily. 90 capsule 3  . latanoprost (XALATAN) 0.005 % ophthalmic solution Place 1 drop into both eyes at bedtime.     . meclizine (ANTIVERT) 12.5 MG tablet Take 1 tablet (12.5 mg total) by mouth 3 (three) times daily as needed for dizziness. 30 tablet 0  . meloxicam (MOBIC) 7.5 MG tablet Take 1 tablet (7.5 mg total) by mouth daily. Use as needed for shoulder and hand pain 30 tablet 1   No  current facility-administered medications on file prior to visit.     BP (!) 122/59   Pulse 70   Temp 98.3 F (36.8 C) (Oral)   Resp 16   Ht 5\' 7"  (1.702 m)   Wt 188 lb 12.8 oz (85.6 kg)   SpO2 99%   BMI 29.57 kg/m       Objective:   Physical Exam   General Mental Status- Alert. General Appearance- Not in acute distress.   Skin General: Color- Normal Color. Moisture- Normal Moisture.  Neck .  On palpation today no cervical spine tenderness to palpation.  No trapezius tenderness as well.  Chest and Lung Exam Auscultation: Breath Sounds:-Normal.  Cardiovascular Auscultation:Rythm- Regular. Murmurs & Other Heart Sounds:Auscultation of the heart reveals- No Murmurs.  Abdomen Inspection:-Inspeection Normal. Palpation/Percussion:Note:No mass. Palpation and Percussion of the abdomen reveal- Non Tender, Non Distended + BS, no rebound or guarding.    Neurologic Cranial Nerve exam:- CN III-XII intact(No nystagmus), symmetric smile. Strength:- 5/5 equal and symmetric strength both upper and lower extremities.  Left side thorax- on inspection no rash presently along the described area of pain.  Area on palpation is not tender today.  No redness, no warmth, and no induration.  Patient does note that when she does get the pain it is sharp/burning pain mostly in the lower rib area beneath her breast.  Describes pain is very transient.  Upper extremity- negative Phalen sign bilaterally.     Assessment & Plan:  You do appear to have some intermittent persistent shingles/nerve type pain.  I do think you have improved some incrementally and would recommend 3 additional days of Famvir and over the next week advance to gabapentin twice daily.  Then give [place] an update in 1 week as to the level of pain and if any rash type breakout has occurred.  You had brief transient bilateral numbness of both hands last night.  No current mid cervical pain and negative testing in office today for  carpal tunnel syndrome.  If the numbness returns please let me know and in that event might recommend a wrist cock-up splint and low-dose anti-inflammatory.  Some recent mild reflux symptoms.  Recommend using either Zantac over-the-counter or Tagamet.  You could go downstairs and ask pharmacist regarding their supply Zantac and if that is under recall.  Regarding transient neck pain last night but none presently, I am making Flexeril the lowest dose available to use only at night if you have recurrent neck pain with tightness.  If  neck pain does return you could also get cervical spine x-ray.  Future order placed.  Follow-up in 1 week or as needed.  Mackie Pai, PA-C

## 2018-06-25 ENCOUNTER — Other Ambulatory Visit: Payer: Self-pay

## 2018-06-26 DIAGNOSIS — H25013 Cortical age-related cataract, bilateral: Secondary | ICD-10-CM | POA: Diagnosis not present

## 2018-06-26 DIAGNOSIS — H401131 Primary open-angle glaucoma, bilateral, mild stage: Secondary | ICD-10-CM | POA: Diagnosis not present

## 2018-06-26 DIAGNOSIS — H2513 Age-related nuclear cataract, bilateral: Secondary | ICD-10-CM | POA: Diagnosis not present

## 2018-07-01 ENCOUNTER — Other Ambulatory Visit: Payer: Self-pay | Admitting: Medical

## 2018-09-26 ENCOUNTER — Telehealth: Payer: Self-pay | Admitting: Family Medicine

## 2018-09-26 NOTE — Telephone Encounter (Signed)
Patient received call from [facility] to schedule AWV. Returned call to patient, but no answer. Could not leave message. SF

## 2019-01-15 DIAGNOSIS — H2513 Age-related nuclear cataract, bilateral: Secondary | ICD-10-CM | POA: Diagnosis not present

## 2019-01-15 DIAGNOSIS — H25013 Cortical age-related cataract, bilateral: Secondary | ICD-10-CM | POA: Diagnosis not present

## 2019-01-15 DIAGNOSIS — H401131 Primary open-angle glaucoma, bilateral, mild stage: Secondary | ICD-10-CM | POA: Diagnosis not present

## 2019-02-07 ENCOUNTER — Other Ambulatory Visit: Payer: Self-pay | Admitting: Family Medicine

## 2019-02-07 DIAGNOSIS — Z1231 Encounter for screening mammogram for malignant neoplasm of breast: Secondary | ICD-10-CM

## 2019-03-26 ENCOUNTER — Ambulatory Visit
Admission: RE | Admit: 2019-03-26 | Discharge: 2019-03-26 | Disposition: A | Discharge status: Home / Self Care | Payer: Medicare Other | Source: Ambulatory Visit | Attending: Family Medicine | Admitting: Family Medicine

## 2019-03-26 ENCOUNTER — Other Ambulatory Visit: Payer: Self-pay

## 2019-03-26 DIAGNOSIS — Z1231 Encounter for screening mammogram for malignant neoplasm of breast: Secondary | ICD-10-CM

## 2019-03-27 ENCOUNTER — Other Ambulatory Visit: Payer: Self-pay | Admitting: Family Medicine

## 2019-03-27 DIAGNOSIS — R928 Other abnormal and inconclusive findings on diagnostic imaging of breast: Secondary | ICD-10-CM

## 2019-03-29 ENCOUNTER — Ambulatory Visit
Admission: RE | Admit: 2019-03-29 | Discharge: 2019-03-29 | Disposition: A | Discharge status: Home / Self Care | Payer: Medicare Other | Source: Ambulatory Visit | Attending: Family Medicine | Admitting: Family Medicine

## 2019-03-29 ENCOUNTER — Ambulatory Visit: Payer: Medicare Other

## 2019-03-29 ENCOUNTER — Other Ambulatory Visit: Payer: Self-pay

## 2019-03-29 DIAGNOSIS — R928 Other abnormal and inconclusive findings on diagnostic imaging of breast: Secondary | ICD-10-CM

## 2019-04-26 DIAGNOSIS — Z23 Encounter for immunization: Secondary | ICD-10-CM | POA: Diagnosis not present

## 2019-07-02 ENCOUNTER — Other Ambulatory Visit: Payer: Self-pay

## 2019-07-16 ENCOUNTER — Ambulatory Visit: Payer: Medicare Other | Admitting: *Deleted

## 2019-07-17 DIAGNOSIS — H43813 Vitreous degeneration, bilateral: Secondary | ICD-10-CM | POA: Diagnosis not present

## 2019-07-17 DIAGNOSIS — H524 Presbyopia: Secondary | ICD-10-CM | POA: Diagnosis not present

## 2019-07-17 DIAGNOSIS — H2513 Age-related nuclear cataract, bilateral: Secondary | ICD-10-CM | POA: Diagnosis not present

## 2019-07-17 DIAGNOSIS — H401131 Primary open-angle glaucoma, bilateral, mild stage: Secondary | ICD-10-CM | POA: Diagnosis not present

## 2019-09-29 ENCOUNTER — Ambulatory Visit: Payer: Medicare Other | Attending: Internal Medicine

## 2019-09-29 DIAGNOSIS — Z23 Encounter for immunization: Secondary | ICD-10-CM | POA: Insufficient documentation

## 2019-09-29 NOTE — Progress Notes (Signed)
   Covid-19 Vaccination Clinic  Name:  Vonnetta Schmieg    MRN: [id number] DOB: 13-Jun-1941  09/29/2019  Ms. Scoggins was observed post Covid-19 immunization for 15 minutes without incidence. She was provided with Vaccine Information Sheet and instruction to access the V-Safe system.   Ms. Mostoller was instructed to call 911 with any severe reactions post vaccine: Marland Kitchen Difficulty breathing  . Swelling of your face and throat  . A fast heartbeat  . A bad rash all over your body  . Dizziness and weakness    Immunizations Administered    Name Date Dose VIS Date Route   Pfizer COVID-19 Vaccine 09/29/2019 11:34 AM 0.3 mL 07/19/2019 Intramuscular   Manufacturer: [place]   Lot: [id number]   [place]: [id number]

## 2019-10-23 ENCOUNTER — Ambulatory Visit: Payer: Medicare Other | Attending: Internal Medicine

## 2019-10-23 DIAGNOSIS — Z23 Encounter for immunization: Secondary | ICD-10-CM

## 2019-10-23 NOTE — Progress Notes (Signed)
   Covid-19 Vaccination Clinic  Name:  Tara Jimenez    MRN: [id number] DOB: 12/01/1940  10/23/2019  [name] was observed post Covid-19 immunization for 15 minutes without incident. She was provided with Vaccine Information Sheet and instruction to access the V-Safe system.   [name] was instructed to call 911 with any severe reactions post vaccine: Marland Kitchen Difficulty breathing  . Swelling of face and throat  . A fast heartbeat  . A bad rash all over body  . Dizziness and weakness   Immunizations Administered    Name Date Dose VIS Date Route   Pfizer COVID-19 Vaccine 10/23/2019 11:39 AM 0.3 mL 07/19/2019 Intramuscular   Manufacturer: [place]   Lot: [id number]   [place]: [id number]

## 2019-11-19 DIAGNOSIS — H401131 Primary open-angle glaucoma, bilateral, mild stage: Secondary | ICD-10-CM | POA: Diagnosis not present

## 2019-11-19 DIAGNOSIS — H04123 Dry eye syndrome of bilateral lacrimal glands: Secondary | ICD-10-CM | POA: Diagnosis not present

## 2019-12-11 NOTE — Progress Notes (Addendum)
[facility] [address], [place], [place] [address] [phone] [id number]  Date:  12/12/2019   Name:  Tara Jimenez   DOB:  1941/04/07   MRN:  [id number]  PCP:  Darreld Mclean, MD    Chief Complaint: Joint Pain' (hurts all over, cramping, legs arms and shoulder)   History of Present Illness:  Tara Jimenez is a 79 y.o. very pleasant female patient who presents with the following:  Pt with history of MGUS, subdural hematoma in 2011, arthritis and GERD Last seen by myself in February of 2019 She has done ok during the covid pandemic-she did not get sick, has completed her vaccine series  Today she has concern of "pains and weakness in my legs, and my shoulders and my arms, and all over" This has been present for a couple of days only-today she actually feels significantly better Per pt her MGUS has been stable and she no longer needs to follow-up with oncology  No fever or chills  About 5 days ago she had an episode of CP which she attributes to hiatal hernia- it has not happened again since It lasted perhaps 15- 20 minutes, drinking water helped it to go away  She is not sure what she was doing at the time of this episode-if she was active or at rest, she cannot remember No SOB  No further episodes of chest pain  colonoscopy 2011-now due for update, sister with history of colon cancer  Her husband is currently ill with lung cancer, he requests to become a new patient  Patient Active Problem List   Diagnosis Date Noted  . Right foot drop 08/03/2016  . Borderline blood pressure 08/03/2016  . MGUS (monoclonal gammopathy of unknown significance) 08/17/2015  . Leukopenia 07/22/2015  . Paresthesias 07/22/2015  . Bone pain 07/22/2015  . Microscopic hematuria 02/06/2015  . Subdural hematoma ([place]) 02/19/2010  . OBESITY 02/15/2010  . GERD 02/15/2010  . ARTHRITIS 02/15/2010  . UTI'S, HX OF 02/15/2010    Past Medical  History:  Diagnosis Date  . Arthritis   . Glaucoma     Past Surgical History:  Procedure Laterality Date  . BRAIN SURGERY      Social History   Tobacco Use  . Smoking status: Never Smoker  . Smokeless tobacco: Never Used  Substance Use Topics  . Alcohol use: No    Alcohol/week: 0.0 standard drinks  . Drug use: No    Family History  Problem Relation Age of Onset  . Diabetes Brother   . Heart disease Brother   . Hypertension Brother   . Aneurysm Brother   . Breast cancer Neg Hx     No Known Allergies  Medication list has been reviewed and updated.  Current Outpatient Medications on File Prior to Visit  Medication Sig Dispense Refill  . aspirin EC 81 MG tablet Take 81 mg by mouth daily.    Marland Kitchen latanoprost (XALATAN) 0.005 % ophthalmic solution Place 1 drop into both eyes at bedtime.     . gabapentin (NEURONTIN) 100 MG capsule TAKE 1 CAPSULE BY MOUTH THREE TIMES A DAY (Patient not taking: Reported on 12/12/2019) 270 capsule 2   No current facility-administered medications on file prior to visit.    Review of Systems:  As per HPI- otherwise negative.   Physical Examination: Vitals:   12/12/19 1044 12/12/19 1121  BP: (!) 167/77 (!) 155/70  Pulse: 73   Resp: 17  Temp: (!) 97.5 F (36.4 C)   SpO2: 98%    Vitals:   12/12/19 1044  Weight: 198 lb (89.8 kg)  Height: 5\' 7"  (1.702 m)   Body mass index is 31.01 kg/m. Ideal Body Weight: Weight in (lb) to have BMI = 25: 159.3  GEN: no acute distress. Overweight, looks well  HEENT: Atraumatic, Normocephalic.  Ears and Nose: No external deformity. CV: RRR, No M/G/R. No JVD. No thrill. No extra heart sounds. PULM: CTA B, no wheezes, crackles, rhonchi. No retractions. No resp. distress. No accessory muscle use. ABD: S, NT, ND, +BS. No rebound. No HSM.  Belly benign No tenderness of major muscle groups to palpation  EXTR: No c/c/e PSYCH: Normally interactive. Conversant.   EKG: NSR, possible old infarct per tracing   C/w 2016 no significant changes noted  BP Readings from Last 3 Encounters:  12/12/19 (!) 155/70  06/15/18 (!) 122/59  06/08/18 (!) 156/63    Assessment and Plan: Body aches - Plan: CBC, TSH, CK, Sedimentation rate, C-reactive protein  Chest pain, unspecified type - Plan: EKG 12-Lead  MGUS (monoclonal gammopathy of unknown significance) - Plan: CBC, Sedimentation rate  Subdural hematoma (HCC)  Screening for diabetes mellitus - Plan: Comprehensive metabolic panel, Hemoglobin A1c  Screening for hyperlipidemia - Plan: Lipid panel  Elevated glucose - Plan: Hemoglobin A1c  Patient is here today for follow-up, I have not seen her in a couple of years Her main complaint today is diffuse body aches over the last couple of days, actually better now.  No other particular symptoms are noted.  Obtain lab evaluation as detailed above  Advised her to contact her gastroenterologist for follow-up ASAP for colon cancer screening  Advised her to start on a blood pressure medication, and also advised a stress test due to recent episode of chest pain.  She declines both of these measures at this time  Will plan further follow- up pending labs. Moderate medical decision making today This visit occurred during the SARS-CoV-2 public health emergency.  Safety protocols were in place, including screening questions prior to the visit, additional usage of staff PPE, and extensive cleaning of exam room while observing appropriate contact time as indicated for disinfecting solutions.    Signed Lamar Blinks, MD  Received her labs as below, 5/7.  Message to patient Slight leukopenia is stable for about 10 years Minimal anemia, she has been asked to contact GI for colon cancer screening Results for orders placed or performed in visit on 12/12/19  CBC  Result Value Ref Range   WBC 3.6 (L) 4.0 - 10.5 K/uL   RBC 4.05 3.87 - 5.11 Mil/uL   Platelets 185.0 150.0 - 400.0 K/uL   Hemoglobin 11.9 (L) 12.0 -  15.0 g/dL   HCT 37.0 36.0 - 46.0 %   MCV 91.3 78.0 - 100.0 fl   MCHC 32.0 30.0 - 36.0 g/dL   RDW 13.7 11.5 - 15.5 %  Comprehensive metabolic panel  Result Value Ref Range   Sodium 141 135 - 145 mEq/L   Potassium 4.1 3.5 - 5.1 mEq/L   Chloride 108 96 - 112 mEq/L   CO2 28 19 - 32 mEq/L   Glucose, Bld 89 70 - 99 mg/dL   BUN 12 6 - 23 mg/dL   Creatinine, Ser 0.89 0.40 - 1.20 mg/dL   Total Bilirubin 0.4 0.2 - 1.2 mg/dL   Alkaline Phosphatase 49 39 - 117 U/L   AST 15 0 - 37 U/L   ALT  11 0 - 35 U/L   Total Protein 7.1 6.0 - 8.3 g/dL   Albumin 3.8 3.5 - 5.2 g/dL   GFR 74.32 >60.00 mL/min   Calcium 9.0 8.4 - 10.5 mg/dL  Hemoglobin A1c  Result Value Ref Range   Hgb A1c MFr Bld 5.3 4.6 - 6.5 %  Lipid panel  Result Value Ref Range   Cholesterol 208 (H) 0 - 200 mg/dL   Triglycerides 100.0 0.0 - 149.0 mg/dL   HDL 52.40 >39.00 mg/dL   VLDL 20.0 0.0 - 40.0 mg/dL   LDL Cholesterol 136 (H) 0 - 99 mg/dL   Total CHOL/HDL Ratio 4    NonHDL 155.87   TSH  Result Value Ref Range   TSH 1.33 0.35 - 4.50 uIU/mL  CK  Result Value Ref Range   Total CK 62 7 - 177 U/L  Sedimentation rate  Result Value Ref Range   Sed Rate 75 (H) 0 - 30 mm/hr  C-reactive protein  Result Value Ref Range   CRP <1.0 0.5 - 20.0 mg/dL    The 10-year ASCVD risk score Mikey Bussing DC Jr., et al., 2013) is: 24.2%   Values used to calculate the score:     Age: 13 years     Sex: Female     Is Non-Hispanic African American: Yes     Diabetic: No     Tobacco smoker: No     Systolic Blood Pressure: 99991111 mmHg     Is BP treated: No     HDL Cholesterol: 52.4 mg/dL     Total Cholesterol: 208 mg/dL

## 2019-12-11 NOTE — Patient Instructions (Addendum)
It was good to see you again today.  I will be in touch with your labs asap- we will look for any explanation for the aching that you have noted recently   You are due for a repeat colonoscoypy this year- please call  GI to schedule, you have seen Dr Ardis Hughs most recently   Located in: [facility] Address: [address], [place], [place] [address] Hours:  Open ? Closes 5PM Phone: [phone] Suggest an edit  Own this b   Please let me know if you have any further episodes of chest pain  Please see me in 6 months for our next visit

## 2019-12-12 ENCOUNTER — Encounter: Payer: Self-pay | Admitting: Family Medicine

## 2019-12-12 ENCOUNTER — Other Ambulatory Visit: Payer: Self-pay

## 2019-12-12 ENCOUNTER — Ambulatory Visit (INDEPENDENT_AMBULATORY_CARE_PROVIDER_SITE_OTHER): Payer: Medicare Other | Admitting: Family Medicine

## 2019-12-12 VITALS — BP 155/70 | HR 73 | Temp 97.5°F | Resp 17 | Ht 67.0 in | Wt 198.0 lb

## 2019-12-12 DIAGNOSIS — R7309 Other abnormal glucose: Secondary | ICD-10-CM

## 2019-12-12 DIAGNOSIS — D472 Monoclonal gammopathy: Secondary | ICD-10-CM

## 2019-12-12 DIAGNOSIS — R700 Elevated erythrocyte sedimentation rate: Secondary | ICD-10-CM

## 2019-12-12 DIAGNOSIS — R52 Pain, unspecified: Secondary | ICD-10-CM | POA: Diagnosis not present

## 2019-12-12 DIAGNOSIS — Z131 Encounter for screening for diabetes mellitus: Secondary | ICD-10-CM | POA: Diagnosis not present

## 2019-12-12 DIAGNOSIS — S065X9A Traumatic subdural hemorrhage with loss of consciousness of unspecified duration, initial encounter: Secondary | ICD-10-CM | POA: Diagnosis not present

## 2019-12-12 DIAGNOSIS — S065XAA Traumatic subdural hemorrhage with loss of consciousness status unknown, initial encounter: Secondary | ICD-10-CM

## 2019-12-12 DIAGNOSIS — Z13220 Encounter for screening for lipoid disorders: Secondary | ICD-10-CM

## 2019-12-12 DIAGNOSIS — R079 Chest pain, unspecified: Secondary | ICD-10-CM | POA: Diagnosis not present

## 2019-12-12 LAB — LIPID PANEL
Cholesterol: 208 mg/dL — ABNORMAL HIGH (ref 0–200)
HDL: 52.4 mg/dL (ref >39.00)
LDL Cholesterol: 136 mg/dL — ABNORMAL HIGH (ref 0–99)
NonHDL: 155.87
Total CHOL/HDL Ratio: 4
Triglycerides: 100 mg/dL (ref 0.0–149.0)
VLDL: 20 mg/dL (ref 0.0–40.0)

## 2019-12-12 LAB — CBC
HCT: 37 % (ref 36.0–46.0)
Hemoglobin: 11.9 g/dL — ABNORMAL LOW (ref 12.0–15.0)
MCHC: 32 g/dL (ref 30.0–36.0)
MCV: 91.3 fl (ref 78.0–100.0)
Platelets: 185 10*3/uL (ref 150.0–400.0)
RBC: 4.05 Mil/uL (ref 3.87–5.11)
RDW: 13.7 % (ref 11.5–15.5)
WBC: 3.6 10*3/uL — ABNORMAL LOW (ref 4.0–10.5)

## 2019-12-12 LAB — COMPREHENSIVE METABOLIC PANEL
ALT: 11 U/L (ref 0–35)
AST: 15 U/L (ref 0–37)
Albumin: 3.8 g/dL (ref 3.5–5.2)
Alkaline Phosphatase: 49 U/L (ref 39–117)
BUN: 12 mg/dL (ref 6–23)
CO2: 28 mEq/L (ref 19–32)
Calcium: 9 mg/dL (ref 8.4–10.5)
Chloride: 108 mEq/L (ref 96–112)
Creatinine, Ser: 0.89 mg/dL (ref 0.40–1.20)
GFR: 74.32 mL/min (ref >60.00)
Glucose, Bld: 89 mg/dL (ref 70–99)
Potassium: 4.1 mEq/L (ref 3.5–5.1)
Sodium: 141 mEq/L (ref 135–145)
Total Bilirubin: 0.4 mg/dL (ref 0.2–1.2)
Total Protein: 7.1 g/dL (ref 6.0–8.3)

## 2019-12-12 LAB — HEMOGLOBIN A1C: Hgb A1c MFr Bld: 5.3 % (ref 4.6–6.5)

## 2019-12-12 LAB — C-REACTIVE PROTEIN: CRP: <1 mg/dL (ref 0.5–20.0)

## 2019-12-12 LAB — TSH: TSH: 1.33 u[IU]/mL (ref 0.35–4.50)

## 2019-12-12 LAB — CK: Total CK: 62 U/L (ref 7–177)

## 2019-12-12 LAB — SEDIMENTATION RATE: Sed Rate: 75 mm/hr — ABNORMAL HIGH (ref 0–30)

## 2019-12-13 ENCOUNTER — Encounter: Payer: Self-pay | Admitting: Family Medicine

## 2019-12-13 DIAGNOSIS — M25562 Pain in left knee: Secondary | ICD-10-CM

## 2019-12-13 DIAGNOSIS — N644 Mastodynia: Secondary | ICD-10-CM

## 2019-12-13 DIAGNOSIS — E785 Hyperlipidemia, unspecified: Secondary | ICD-10-CM

## 2019-12-13 DIAGNOSIS — M2550 Pain in unspecified joint: Secondary | ICD-10-CM

## 2019-12-13 MED ORDER — ROSUVASTATIN CALCIUM 10 MG PO TABS: 10.0000 mg | ORAL_TABLET | Freq: Every day | ORAL | 3 refills | Status: DC

## 2019-12-13 MED ORDER — MELOXICAM 7.5 MG PO TABS: 7.5000 mg | ORAL_TABLET | Freq: Every day | ORAL | 1 refills | Status: DC

## 2019-12-13 NOTE — Addendum Note (Signed)
Addended by: Lamar Blinks C on: 12/13/2019 05:19 AM   Modules accepted: Orders

## 2019-12-20 ENCOUNTER — Other Ambulatory Visit (INDEPENDENT_AMBULATORY_CARE_PROVIDER_SITE_OTHER): Payer: Medicare Other

## 2019-12-20 ENCOUNTER — Other Ambulatory Visit: Payer: Self-pay

## 2019-12-20 DIAGNOSIS — R700 Elevated erythrocyte sedimentation rate: Secondary | ICD-10-CM | POA: Diagnosis not present

## 2019-12-20 LAB — SEDIMENTATION RATE: Sed Rate: 51 mm/hr — ABNORMAL HIGH (ref 0–30)

## 2019-12-23 ENCOUNTER — Encounter: Payer: Self-pay | Admitting: Family Medicine

## 2019-12-23 ENCOUNTER — Other Ambulatory Visit: Payer: Self-pay | Admitting: Family Medicine

## 2019-12-23 DIAGNOSIS — R700 Elevated erythrocyte sedimentation rate: Secondary | ICD-10-CM

## 2019-12-23 LAB — RHEUMATOID FACTOR: Rheumatoid fact SerPl-aCnc: <14 IU/mL (ref <14)

## 2019-12-23 LAB — ANTI-NUCLEAR AB-TITER (ANA TITER): ANA Titer 1: 1:40 {titer} — ABNORMAL HIGH

## 2019-12-23 LAB — ANA: Anti Nuclear Antibody (ANA): POSITIVE — AB

## 2020-01-23 ENCOUNTER — Encounter: Payer: Self-pay | Admitting: Gastroenterology

## 2020-01-28 ENCOUNTER — Ambulatory Visit (AMBULATORY_SURGERY_CENTER): Payer: Self-pay

## 2020-01-28 VITALS — Ht 67.0 in | Wt 198.2 lb

## 2020-01-28 DIAGNOSIS — Z86010 Personal history of colonic polyps: Secondary | ICD-10-CM

## 2020-01-28 DIAGNOSIS — Z800 Family history of malignant neoplasm of digestive organs: Secondary | ICD-10-CM

## 2020-01-28 NOTE — Progress Notes (Signed)
No allergies to soy or egg Pt is not on blood thinners or diet pills Denies issues with sedation/intubation Denies atrial flutter/fib Denies constipation   Pt is aware of Covid safety and care partner requirements.   Pt did not want to take the Doculax tablets- prep changed to Plenvu with sample given  Lot: [id number]  Exp 01/22

## 2020-02-07 ENCOUNTER — Other Ambulatory Visit: Payer: Self-pay | Admitting: Family Medicine

## 2020-02-07 DIAGNOSIS — M2550 Pain in unspecified joint: Secondary | ICD-10-CM

## 2020-02-07 DIAGNOSIS — N644 Mastodynia: Secondary | ICD-10-CM

## 2020-02-07 DIAGNOSIS — M25562 Pain in left knee: Secondary | ICD-10-CM

## 2020-02-25 ENCOUNTER — Encounter: Payer: Self-pay | Admitting: Certified Registered Nurse Anesthetist

## 2020-02-26 ENCOUNTER — Other Ambulatory Visit: Payer: Self-pay

## 2020-02-26 ENCOUNTER — Ambulatory Visit (AMBULATORY_SURGERY_CENTER): Payer: Medicare Other | Admitting: Gastroenterology

## 2020-02-26 ENCOUNTER — Encounter: Payer: Self-pay | Admitting: Gastroenterology

## 2020-02-26 VITALS — BP 134/64 | HR 71 | Temp 96.8°F | Resp 24 | Ht 67.0 in | Wt 198.2 lb

## 2020-02-26 DIAGNOSIS — Z1211 Encounter for screening for malignant neoplasm of colon: Secondary | ICD-10-CM | POA: Diagnosis not present

## 2020-02-26 DIAGNOSIS — Z800 Family history of malignant neoplasm of digestive organs: Secondary | ICD-10-CM | POA: Diagnosis not present

## 2020-02-26 DIAGNOSIS — E7800 Pure hypercholesterolemia, unspecified: Secondary | ICD-10-CM | POA: Diagnosis not present

## 2020-02-26 DIAGNOSIS — Z86010 Personal history of colonic polyps: Secondary | ICD-10-CM | POA: Diagnosis not present

## 2020-02-26 MED ORDER — SODIUM CHLORIDE 0.9 % IV SOLN
500.0000 mL | Freq: Once | INTRAVENOUS | Status: DC
Start: 2020-02-26 — End: 2023-05-18

## 2020-02-26 NOTE — Progress Notes (Signed)
VS by CW  Pt's states no medical or surgical changes since previsit or office visit.  

## 2020-02-26 NOTE — Progress Notes (Signed)
Report given to PACU, vss 

## 2020-02-26 NOTE — Op Note (Signed)
[facility] Patient Name: Tara Jimenez Procedure Date: 02/26/2020 2:25 PM MRN: [id number] Endoscopist: Milus Banister , MD Age: 79 Referring MD:  Date of Birth: 11-26-1940 Gender: Female Account #: [id number] Procedure:                Colonoscopy Indications:              Screening in patient at increased risk: Family                            history of 1st-degree relative with colorectal                            cancer (sister had colon cancer) Medicines:                Monitored Anesthesia Care Procedure:                Pre-Anesthesia Assessment:                           - Prior to the procedure, a History and Physical                            was performed, and patient medications and                            allergies were reviewed. The patient's tolerance of                            previous anesthesia was also reviewed. The risks                            and benefits of the procedure and the sedation                            options and risks were discussed with the patient.                            All questions were answered, and informed consent                            was obtained. Prior Anticoagulants: The patient has                            taken no previous anticoagulant or antiplatelet                            agents. ASA Grade Assessment: II - A patient with                            mild systemic disease. After reviewing the risks                            and benefits, the patient was deemed in  satisfactory condition to undergo the procedure.                           After obtaining informed consent, the colonoscope                            was passed under direct vision. Throughout the                            procedure, the patient's blood pressure, pulse, and                            oxygen saturations were monitored continuously. The                            Colonoscope was introduced  through the anus and                            advanced to the the cecum, identified by                            appendiceal orifice and ileocecal valve. The                            colonoscopy was performed without difficulty. The                            patient tolerated the procedure well. The quality                            of the bowel preparation was good. The ileocecal                            valve, appendiceal orifice, and rectum were                            photographed. Scope In: 2:38:25 PM Scope Out: 2:51:02 PM Scope Withdrawal Time: 0 hours 8 minutes 58 seconds  Total Procedure Duration: 0 hours 12 minutes 37 seconds  Findings:                 Multiple small and large-mouthed diverticula were                            found in the left colon.                           External and internal hemorrhoids were found. The                            hemorrhoids were small.                           The exam was otherwise without abnormality on  direct and retroflexion views. Complications:            No immediate complications. Estimated blood loss:                            None. Estimated Blood Loss:     Estimated blood loss: none. Impression:               - Diverticulosis in the left colon.                           - External and internal hemorrhoids.                           - The examination was otherwise normal on direct                            and retroflexion views.                           - No polyps or cancers. Recommendation:           - Patient has a contact number available for                            emergencies. The signs and symptoms of potential                            delayed complications were discussed with the                            patient. Return to normal activities tomorrow.                            Written discharge instructions were provided to the                            patient.                            - Resume previous diet.                           - Continue present medications.                           - No repeat colonoscopy, colon cancer screening                            usually stops between age 72-80. Milus Banister, MD 02/26/2020 2:53:25 PM This report has been signed electronically.

## 2020-02-26 NOTE — Patient Instructions (Signed)
YOU HAD AN ENDOSCOPIC PROCEDURE TODAY AT THE [facility]:   Refer to the procedure report that was given to you for any specific questions about what was found during the examination.  If the procedure report does not answer your questions, please call your gastroenterologist to clarify.  If you requested that your care partner not be given the details of your procedure findings, then the procedure report has been included in a sealed envelope for you to review at your convenience later. ° °YOU SHOULD EXPECT: Some feelings of bloating in the abdomen. Passage of more gas than usual.  Walking can help get rid of the air that was put into your GI tract during the procedure and reduce the bloating. If you had a lower endoscopy (such as a colonoscopy or flexible sigmoidoscopy) you may notice spotting of blood in your stool or on the toilet paper. If you underwent a bowel prep for your procedure, you may not have a normal bowel movement for a few days. ° °Please Note:  You might notice some irritation and congestion in your nose or some drainage.  This is from the oxygen used during your procedure.  There is no need for concern and it should clear up in a day or so. ° °SYMPTOMS TO REPORT IMMEDIATELY: ° °Following lower endoscopy (colonoscopy or flexible sigmoidoscopy): ° Excessive amounts of blood in the stool ° Significant tenderness or worsening of abdominal pains ° Swelling of the abdomen that is new, acute ° Fever of 100°F or higher ° °  °For urgent or emergent issues, a gastroenterologist can be reached at any hour by calling [phone]. °Do not use MyChart messaging for urgent concerns.  ° ° °DIET:  We do recommend a small meal at first, but then you may proceed to your regular diet.  Drink plenty of fluids but you should avoid alcoholic beverages for 24 hours. °Try to increase the fiber in your diet, and drink plenty of water. ° °ACTIVITY:  You should plan to take it easy for the rest of today and  you should NOT DRIVE or use heavy machinery until tomorrow (because of the sedation medicines used during the test).   ° °FOLLOW UP: °Our staff will call the number listed on your records 48-72 hours following your procedure to check on you and address any questions or concerns that you may have regarding the information given to you following your procedure. If we do not reach you, we will leave a message.  We will attempt to reach you two times.  During this call, we will ask if you have developed any symptoms of COVID 19. If you develop any symptoms (ie: fever, flu-like symptoms, shortness of breath, cough etc.) before then, please call [phone].  If you test positive for Covid 19 in the 2 weeks post procedure, please call and report this information to us.   ° °If any biopsies were taken you will be contacted by phone or by letter within the next 1-3 weeks.  Please call us at [phone] if you have not heard about the biopsies in 3 weeks.  ° ° °SIGNATURES/CONFIDENTIALITY: °You and/or your care partner have signed paperwork which will be entered into your electronic medical record.  These signatures attest to the fact that that the information above on your After Visit Summary has been reviewed and is understood.  Full responsibility of the confidentiality of this discharge information lies with you and/or your care-partner.  °

## 2020-02-28 ENCOUNTER — Telehealth: Payer: Self-pay

## 2020-02-28 NOTE — Telephone Encounter (Signed)
  Follow up Call-  Call back number 02/26/2020  Post procedure Call Back phone  # [phone]  Permission to leave phone message No  Some recent data might be hidden     Patient questions:  Do you have a fever, pain , or abdominal swelling? No. Pain Score  0 *  Have you tolerated food without any problems? Yes.    Have you been able to return to your normal activities? Yes.    Do you have any questions about your discharge instructions: Diet   No. Medications  No. Follow up visit  No.  Do you have questions or concerns about your Care? No.  Actions: * If pain score is 4 or above: No action needed, pain <4.   1. Have you developed a fever since your procedure? no  2.   Have you had an respiratory symptoms (SOB or cough) since your procedure? no  3.   Have you tested positive for COVID 19 since your procedure no  4.   Have you had any family members/close contacts diagnosed with the COVID 19 since your procedure?  no   If yes to any of these questions please route to Joylene John, RN and Erenest Rasher, RN

## 2020-04-01 DIAGNOSIS — H2513 Age-related nuclear cataract, bilateral: Secondary | ICD-10-CM | POA: Diagnosis not present

## 2020-04-01 DIAGNOSIS — H25013 Cortical age-related cataract, bilateral: Secondary | ICD-10-CM | POA: Diagnosis not present

## 2020-04-01 DIAGNOSIS — H43813 Vitreous degeneration, bilateral: Secondary | ICD-10-CM | POA: Diagnosis not present

## 2020-04-01 DIAGNOSIS — H401131 Primary open-angle glaucoma, bilateral, mild stage: Secondary | ICD-10-CM | POA: Diagnosis not present

## 2020-05-25 ENCOUNTER — Other Ambulatory Visit: Payer: Self-pay | Admitting: Family Medicine

## 2020-05-25 DIAGNOSIS — Z1231 Encounter for screening mammogram for malignant neoplasm of breast: Secondary | ICD-10-CM

## 2020-05-26 ENCOUNTER — Ambulatory Visit
Admission: RE | Admit: 2020-05-26 | Discharge: 2020-05-26 | Disposition: A | Discharge status: Home / Self Care | Payer: Medicare Other | Source: Ambulatory Visit | Attending: Family Medicine | Admitting: Family Medicine

## 2020-05-26 ENCOUNTER — Other Ambulatory Visit: Payer: Self-pay

## 2020-05-26 DIAGNOSIS — Z1231 Encounter for screening mammogram for malignant neoplasm of breast: Secondary | ICD-10-CM

## 2020-05-29 ENCOUNTER — Ambulatory Visit: Payer: Medicare Other | Attending: Internal Medicine

## 2020-05-29 ENCOUNTER — Other Ambulatory Visit (HOSPITAL_BASED_OUTPATIENT_CLINIC_OR_DEPARTMENT_OTHER): Payer: Self-pay | Admitting: Internal Medicine

## 2020-05-29 DIAGNOSIS — Z23 Encounter for immunization: Secondary | ICD-10-CM

## 2020-05-29 NOTE — Progress Notes (Signed)
   Covid-19 Vaccination Clinic  Name:  Fatiha Guzy    MRN: [id number] DOB: [birth year]  05/29/2020  Ms. Vantuyl was observed post Covid-19 immunization for 15 minutes without incident. She was provided with Vaccine Information Sheet and instruction to access the V-Safe system.  Vaccinated by [facility]  Ms. Gullatt was instructed to call 911 with any severe reactions post vaccine: Marland Kitchen Difficulty breathing  . Swelling of face and throat  . A fast heartbeat  . A bad rash all over body  . Dizziness and weakness

## 2020-06-05 ENCOUNTER — Other Ambulatory Visit: Payer: Self-pay | Admitting: Family Medicine

## 2020-06-05 DIAGNOSIS — M25562 Pain in left knee: Secondary | ICD-10-CM

## 2020-06-05 DIAGNOSIS — N644 Mastodynia: Secondary | ICD-10-CM

## 2020-06-05 DIAGNOSIS — M2550 Pain in unspecified joint: Secondary | ICD-10-CM

## 2020-06-05 MED FILL — PFIZER-BIONTECH COVID-19 VA: 30 | 1 days supply | Qty: 0 | Fill #0

## 2020-06-19 NOTE — Progress Notes (Addendum)
[place] at [facility] [address] [place], [place] [address] [phone]  Date:  06/22/2020   Name:  Tara Jimenez   DOB:  Feb 11, 1941   MRN:  [id number]  PCP:  Darreld Mclean, MD    Chief Complaint: Follow-up   History of Present Illness:  Tara Jimenez is a 79 y.o. very pleasant female patient who presents with the following:  Tara Jimenez is here today for periodic follow-up visit-history of MGUS, subdural hematoma in 2011, arthritis and GERD, glaucoma managed by optho DR Satira Sark  She last saw oncology in 2018- we are not quite clear about if she needs to follow-up.  Will touch base with her oncologist about this for her.  Last seen by myself in May of this year  She underwent a colonoscopy per Dr. Ardis Hughs in July  Hepatitis C screening-will complete today  Flu vaccine- give today  COVID-19 done including booster Most recent labs done in May, mild leukopenia and anemia at that time, elevated sed rate Her husband has lung cancer- he is receiving chemo and is doing "ok" The rest of her family is doing ok  shingrix- not done yet, she did have chicken pox and actually had shingles about 2 years ago. Discussed with her today-encouraged her to get Shingrix vaccine at her drugstore at her convenience Patient Active Problem List   Diagnosis Date Noted  . Dyslipidemia 12/13/2019  . Right foot drop 08/03/2016  . Borderline blood pressure 08/03/2016  . MGUS (monoclonal gammopathy of unknown significance) 08/17/2015  . Leukopenia 07/22/2015  . Paresthesias 07/22/2015  . Bone pain 07/22/2015  . Microscopic hematuria 02/06/2015  . Subdural hematoma ([place]) 02/19/2010  . OBESITY 02/15/2010  . GERD 02/15/2010  . ARTHRITIS 02/15/2010  . UTI'S, HX OF 02/15/2010    Past Medical History:  Diagnosis Date  . Arthritis   . Glaucoma   . Hyperlipidemia     Past Surgical History:  Procedure Laterality Date  . BRAIN SURGERY    .  COLONOSCOPY  2011    Social History   Tobacco Use  . Smoking status: Never Smoker  . Smokeless tobacco: Never Used  Vaping Use  . Vaping Use: Never used  Substance Use Topics  . Alcohol use: No    Alcohol/week: 0.0 standard drinks  . Drug use: No    Family History  Problem Relation Age of Onset  . Colitis Sister 63  . Diabetes Brother   . Heart disease Brother   . Hypertension Brother   . Aneurysm Brother   . Breast cancer Neg Hx   . Colon cancer Neg Hx   . Esophageal cancer Neg Hx   . Rectal cancer Neg Hx   . Stomach cancer Neg Hx     No Known Allergies  Medication list has been reviewed and updated.  Current Outpatient Medications on File Prior to Visit  Medication Sig Dispense Refill  . aspirin EC 81 MG tablet Take 81 mg by mouth daily.    Marland Kitchen latanoprost (XALATAN) 0.005 % ophthalmic solution Place 1 drop into both eyes at bedtime.     . meloxicam (MOBIC) 7.5 MG tablet TAKE 1 TABLET (7.5 MG TOTAL) BY MOUTH DAILY. USE AS NEEDED FOR PAIN 30 tablet 3  . rosuvastatin (CRESTOR) 10 MG tablet Take 1 tablet (10 mg total) by mouth daily. 90 tablet 3   Current Facility-Administered Medications on File Prior to Visit  Medication Dose Route Frequency Provider Last  Rate Last Admin  . 0.9 %  sodium chloride infusion  500 mL Intravenous Once Milus Banister, MD        Review of Systems:  As per HPI- otherwise negative.   Physical Examination: Vitals:   06/22/20 0900  BP: 130/82  Pulse: 74  Resp: 16  Temp: 98.2 F (36.8 C)  SpO2: 99%   Vitals:   06/22/20 0900  Weight: 199 lb 6.4 oz (90.4 kg)   Body mass index is 31.23 kg/m. Ideal Body Weight:    GEN: no acute distress.  Overweight, looks well HEENT: Atraumatic, Normocephalic.   PEERL, EOMI Ears and Nose: No external deformity. CV: RRR, No M/G/R. No JVD. No thrill. No extra heart sounds. PULM: CTA B, no wheezes, crackles, rhonchi. No retractions. No resp. distress. No accessory muscle use. ABD: S, NT, ND,  +BS. No rebound. No HSM. EXTR: No c/c/e PSYCH: Normally interactive. Conversant.    Assessment and Plan: MGUS (monoclonal gammopathy of unknown significance) - Plan: CBC with Differential/Platelet, Basic metabolic panel  Elevated sed rate - Plan: Sedimentation rate, Basic metabolic panel  Encounter for hepatitis C screening test for low risk patient - Plan: Hepatitis C antibody  Leukopenia, unspecified type - Plan: CBC with Differential/Platelet, CANCELED: CBC  Dyslipidemia  Need for influenza vaccination - Plan: Flu Vaccine QUAD High Dose(Fluad)  Patient today for follow-up visit.  She does have history of MGUS, I am not clear if she was meant to continue following up with oncology.  We will touch base with her oncologist about this today, check CBC and BMP, sed rate  Updated flu vaccine, encouraged shingles vaccine at pharmacy  Screening for hepatitis C done today  Dyslipidemia, patient is compliant with her Crestor without any side effects.  Lipid panel completed in May  I will be in touch with her labs, assuming all is well plan to visit in 6 months This visit occurred during the SARS-CoV-2 public health emergency.  Safety protocols were in place, including screening questions prior to the visit, additional usage of staff PPE, and extensive cleaning of exam room while observing appropriate contact time as indicated for disinfecting solutions.    Signed Lamar Blinks, MD  Received her labs as below, message to pt  Results for orders placed or performed in visit on 06/22/20  Sedimentation rate  Result Value Ref Range   Sed Rate 50 (H) 0 - 30 mm/hr  CBC with Differential/Platelet  Result Value Ref Range   WBC 3.1 (L) 4.0 - 10.5 K/uL   RBC 4.14 3.87 - 5.11 Mil/uL   Hemoglobin 12.0 12.0 - 15.0 g/dL   HCT 37.2 36 - 46 %   MCV 89.8 78.0 - 100.0 fl   MCHC 32.3 30.0 - 36.0 g/dL   RDW 13.8 11.5 - 15.5 %   Platelets 166.0 150 - 400 K/uL   Neutrophils Relative % 53.7 43 -  77 %   Lymphocytes Relative 35.9 12 - 46 %   Monocytes Relative 7.9 3 - 12 %   Eosinophils Relative 2.2 0 - 5 %   Basophils Relative 0.3 0 - 3 %   Neutro Abs 1.7 1.4 - 7.7 K/uL   Lymphs Abs 1.1 0.7 - 4.0 K/uL   Monocytes Absolute 0.2 0.1 - 1.0 K/uL   Eosinophils Absolute 0.1 0.0 - 0.7 K/uL   Basophils Absolute 0.0 0.0 - 0.1 K/uL  Basic metabolic panel  Result Value Ref Range   Sodium 143 135 - 145 mEq/L  Potassium 4.0 3.5 - 5.1 mEq/L   Chloride 109 96 - 112 mEq/L   CO2 27 19 - 32 mEq/L   Glucose, Bld 87 70 - 99 mg/dL   BUN 13 6 - 23 mg/dL   Creatinine, Ser 0.85 0.40 - 1.20 mg/dL   GFR 65.81 >60.00 mL/min   Calcium 8.7 8.4 - 10.5 mg/dL

## 2020-06-22 ENCOUNTER — Ambulatory Visit: Payer: Medicare Other | Admitting: *Deleted

## 2020-06-22 ENCOUNTER — Other Ambulatory Visit: Payer: Self-pay

## 2020-06-22 ENCOUNTER — Encounter: Payer: Self-pay | Admitting: Family Medicine

## 2020-06-22 ENCOUNTER — Ambulatory Visit (INDEPENDENT_AMBULATORY_CARE_PROVIDER_SITE_OTHER): Payer: Medicare Other | Admitting: Family Medicine

## 2020-06-22 VITALS — BP 130/82 | HR 74 | Temp 98.2°F | Resp 16 | Wt 199.4 lb

## 2020-06-22 DIAGNOSIS — Z1159 Encounter for screening for other viral diseases: Secondary | ICD-10-CM | POA: Diagnosis not present

## 2020-06-22 DIAGNOSIS — R700 Elevated erythrocyte sedimentation rate: Secondary | ICD-10-CM | POA: Diagnosis not present

## 2020-06-22 DIAGNOSIS — D472 Monoclonal gammopathy: Secondary | ICD-10-CM | POA: Diagnosis not present

## 2020-06-22 DIAGNOSIS — D72819 Decreased white blood cell count, unspecified: Secondary | ICD-10-CM

## 2020-06-22 DIAGNOSIS — Z23 Encounter for immunization: Secondary | ICD-10-CM | POA: Diagnosis not present

## 2020-06-22 DIAGNOSIS — E785 Hyperlipidemia, unspecified: Secondary | ICD-10-CM | POA: Diagnosis not present

## 2020-06-22 LAB — BASIC METABOLIC PANEL
BUN: 13 mg/dL (ref 6–23)
CO2: 27 mEq/L (ref 19–32)
Calcium: 8.7 mg/dL (ref 8.4–10.5)
Chloride: 109 mEq/L (ref 96–112)
Creatinine, Ser: 0.85 mg/dL (ref 0.40–1.20)
GFR: 65.81 mL/min (ref >60.00)
Glucose, Bld: 87 mg/dL (ref 70–99)
Potassium: 4 mEq/L (ref 3.5–5.1)
Sodium: 143 mEq/L (ref 135–145)

## 2020-06-22 LAB — CBC WITH DIFFERENTIAL/PLATELET
Basophils Absolute: 0 10*3/uL (ref 0.0–0.1)
Basophils Relative: 0.3 % (ref 0.0–3.0)
Eosinophils Absolute: 0.1 10*3/uL (ref 0.0–0.7)
Eosinophils Relative: 2.2 % (ref 0.0–5.0)
HCT: 37.2 % (ref 36.0–46.0)
Hemoglobin: 12 g/dL (ref 12.0–15.0)
Lymphocytes Relative: 35.9 % (ref 12.0–46.0)
Lymphs Abs: 1.1 10*3/uL (ref 0.7–4.0)
MCHC: 32.3 g/dL (ref 30.0–36.0)
MCV: 89.8 fl (ref 78.0–100.0)
Monocytes Absolute: 0.2 10*3/uL (ref 0.1–1.0)
Monocytes Relative: 7.9 % (ref 3.0–12.0)
Neutro Abs: 1.7 10*3/uL (ref 1.4–7.7)
Neutrophils Relative %: 53.7 % (ref 43.0–77.0)
Platelets: 166 10*3/uL (ref 150.0–400.0)
RBC: 4.14 Mil/uL (ref 3.87–5.11)
RDW: 13.8 % (ref 11.5–15.5)
WBC: 3.1 10*3/uL — ABNORMAL LOW (ref 4.0–10.5)

## 2020-06-22 LAB — SEDIMENTATION RATE: Sed Rate: 50 mm/hr — ABNORMAL HIGH (ref 0–30)

## 2020-06-22 NOTE — Patient Instructions (Addendum)
Good to see you again today We will check routine labs for you- I will do your hepatitis C screening today (we expect will be negative).  I will touch base with your oncologist about your old MGUS diagnosis- I am not sure if you need any further follow-up here.    Please consider getting the shingles vaccine (shingrix) at your pharmacy to help prevent shingles  Flu vaccine given today  Assuming all is well please see me in 6 months

## 2020-06-23 LAB — HEPATITIS C ANTIBODY
Hepatitis C Ab: NONREACTIVE
SIGNAL TO CUT-OFF: 0.01 (ref <1.00)

## 2020-07-03 ENCOUNTER — Telehealth: Payer: Self-pay | Admitting: *Deleted

## 2020-07-03 NOTE — Telephone Encounter (Signed)
Attempted to contact patient to schedule follow up appt w/Dr. Irene Limbo:  home number not in service on several calls and then had recording of "enter remote access code" Daughter Virgina Organ on [place] - contacted Ms. Morgan and LVM on named VM. Trying to reach mother to schedule follow up appt with Dr. Irene Limbo on recommendation of Dr. Lorelei Pont. Gave CB # for CC. Asked daughter to please share message and number with mother so that she may call and make appt at her convenience.

## 2020-09-23 ENCOUNTER — Other Ambulatory Visit: Payer: Self-pay | Admitting: Family Medicine

## 2020-09-23 DIAGNOSIS — N644 Mastodynia: Secondary | ICD-10-CM

## 2020-09-23 DIAGNOSIS — M2550 Pain in unspecified joint: Secondary | ICD-10-CM

## 2020-09-23 DIAGNOSIS — M25562 Pain in left knee: Secondary | ICD-10-CM

## 2020-10-23 ENCOUNTER — Other Ambulatory Visit: Payer: Self-pay | Admitting: Family Medicine

## 2020-10-23 DIAGNOSIS — E785 Hyperlipidemia, unspecified: Secondary | ICD-10-CM

## 2020-12-15 NOTE — Patient Instructions (Addendum)
Good to see you again today!  I will be in touch with your labs asap  Please call Dr Irene Limbo (oncology) - he wanted to see you for follow-up at your convenience Phone: [phone]  Please get a thyroid ultrasound - please stop by the ground floor imaging dept to schedule this at your convenience   I would recommend that you get a 4th dose of covid 19 vaccine, and the shingles series as well

## 2020-12-15 NOTE — Progress Notes (Addendum)
[place] at [facility] [address] [place], [place] [address] [phone]  Date:  12/21/2020   Name:  Tara Jimenez   DOB:  29-Nov-1940   MRN:  [id number]  PCP:  Darreld Mclean, MD    Chief Complaint: No chief complaint on file.   History of Present Illness:  Tara Jimenez is a 80 y.o. very pleasant female patient who presents with the following:  Here today for a 6 month follow-up visit Last seen by myself in November; history of MGUS, subdural hematoma in 2011, arthritis and GERD, glaucoma managed by optho Dr Satira Sark, dyslipidemia   Her glaucoma is under good control -no pain  covid 4th dose- recommend this  shingrix -also recommend mammo UTD Can order a dexa scan - pt declines today   She has not seen oncology in a long time- Dr Grier Mitts office was trying to reach her to set up an appt.   Her husband is ill with cancer- he is not doing well but is stable.  He is at home, he needs some help with his care Her daughters are nearby  BP Readings from Last 3 Encounters:  12/21/20 (!) 154/82  06/22/20 130/82  02/26/20 134/64     Patient Active Problem List   Diagnosis Date Noted  . Dyslipidemia 12/13/2019  . Right foot drop 08/03/2016  . Borderline blood pressure 08/03/2016  . MGUS (monoclonal gammopathy of unknown significance) 08/17/2015  . Leukopenia 07/22/2015  . Paresthesias 07/22/2015  . Bone pain 07/22/2015  . Microscopic hematuria 02/06/2015  . Subdural hematoma ([place]) 02/19/2010  . OBESITY 02/15/2010  . GERD 02/15/2010  . ARTHRITIS 02/15/2010  . UTI'S, HX OF 02/15/2010    Past Medical History:  Diagnosis Date  . Arthritis   . Glaucoma   . Hyperlipidemia     Past Surgical History:  Procedure Laterality Date  . BRAIN SURGERY    . COLONOSCOPY  2011    Social History   Tobacco Use  . Smoking status: Never Smoker  . Smokeless tobacco: Never Used  Vaping Use  . Vaping Use: Never used   Substance Use Topics  . Alcohol use: No    Alcohol/week: 0.0 standard drinks  . Drug use: No    Family History  Problem Relation Age of Onset  . Colitis Sister 72  . Diabetes Brother   . Heart disease Brother   . Hypertension Brother   . Aneurysm Brother   . Breast cancer Neg Hx   . Colon cancer Neg Hx   . Esophageal cancer Neg Hx   . Rectal cancer Neg Hx   . Stomach cancer Neg Hx     No Known Allergies  Medication list has been reviewed and updated.  Current Outpatient Medications on File Prior to Visit  Medication Sig Dispense Refill  . aspirin EC 81 MG tablet Take 81 mg by mouth daily.    Marland Kitchen latanoprost (XALATAN) 0.005 % ophthalmic solution Place 1 drop into both eyes at bedtime.     . meloxicam (MOBIC) 7.5 MG tablet TAKE 1 TABLET (7.5 MG TOTAL) BY MOUTH DAILY. USE AS NEEDED FOR PAIN 30 tablet 3   Current Facility-Administered Medications on File Prior to Visit  Medication Dose Route Frequency Provider Last Rate Last Admin  . 0.9 %  sodium chloride infusion  500 mL Intravenous Once Milus Banister, MD        Review of Systems:  As per HPI-  otherwise negative.   Physical Examination: Vitals:   12/21/20 0854  BP: (!) 154/82  Pulse: 72  Resp: 17  SpO2: 98%   Vitals:   12/21/20 0854  Weight: 194 lb (88 kg)  Height: 5\' 7"  (1.702 m)   Body mass index is 30.38 kg/m. Ideal Body Weight: Weight in (lb) to have BMI = 25: 159.3  GEN: no acute distress.  Overweight, looks well HEENT: Atraumatic, Normocephalic.  Ears and Nose: No external deformity.  Bilateral TM wnl, oropharynx normal.  PEERL,EOMI.   Thyroid is enlarged bilaterally, no nodules palpated CV: RRR, No M/G/R. No JVD. No thrill. No extra heart sounds. PULM: CTA B, no wheezes, crackles, rhonchi. No retractions. No resp. distress. No accessory muscle use. EXTR: No c/c/e PSYCH: Normally interactive. Conversant.    Assessment and Plan: MGUS (monoclonal gammopathy of unknown significance) - Plan:  CBC  Dyslipidemia - Plan: Lipid panel, rosuvastatin (CRESTOR) 10 MG tablet  Screening for diabetes mellitus - Plan: Comprehensive metabolic panel  Thyroid enlargement - Plan: TSH, US THYROID  Elevated BP without diagnosis of hypertension  Immunization due  Following up today.  Tolerating Crestor without any side effects, refill today and check lipid panel Her oncologist, Dr. Irene Limbo was trying to reach her last year.  I provided his phone number, asked her to please contact him and arrange an appointment  Blood pressure is a bit high today.  She does have a home blood pressure cuff.  I asked her to check her blood pressure at home, alert me is running greater than 140/85 on a regular basis  Will order thyroid ultrasound today for thyroid enlargement  Also check TSH  Encouraged COVID-19 fourth dose and shingles vaccines  This visit occurred during the SARS-CoV-2 public health emergency.  Safety protocols were in place, including screening questions prior to the visit, additional usage of staff PPE, and extensive cleaning of exam room while observing appropriate contact time as indicated for disinfecting solutions.    Signed Lamar Blinks, MD  Received her labs as below, message to patient  Results for orders placed or performed in visit on 12/21/20  CBC  Result Value Ref Range   WBC 3.8 (L) 4.0 - 10.5 K/uL   RBC 3.88 3.87 - 5.11 Mil/uL   Platelets 159.0 150.0 - 400.0 K/uL   Hemoglobin 11.5 (L) 12.0 - 15.0 g/dL   HCT 35.1 (L) 36.0 - 46.0 %   MCV 90.4 78.0 - 100.0 fl   MCHC 32.9 30.0 - 36.0 g/dL   RDW 13.8 11.5 - 15.5 %  Comprehensive metabolic panel  Result Value Ref Range   Sodium 143 135 - 145 mEq/L   Potassium 4.1 3.5 - 5.1 mEq/L   Chloride 110 96 - 112 mEq/L   CO2 25 19 - 32 mEq/L   Glucose, Bld 92 70 - 99 mg/dL   BUN 12 6 - 23 mg/dL   Creatinine, Ser 0.83 0.40 - 1.20 mg/dL   Total Bilirubin 0.5 0.2 - 1.2 mg/dL   Alkaline Phosphatase 46 39 - 117 U/L   AST 16 0 -  37 U/L   ALT 15 0 - 35 U/L   Total Protein 6.7 6.0 - 8.3 g/dL   Albumin 3.8 3.5 - 5.2 g/dL   GFR 67.48 >60.00 mL/min   Calcium 8.7 8.4 - 10.5 mg/dL  Lipid panel  Result Value Ref Range   Cholesterol 130 0 - 200 mg/dL   Triglycerides 55.0 0.0 - 149.0 mg/dL   HDL 54.10 >39.00  mg/dL   VLDL 11.0 0.0 - 40.0 mg/dL   LDL Cholesterol 65 0 - 99 mg/dL   Total CHOL/HDL Ratio 2    NonHDL 75.61   TSH  Result Value Ref Range   TSH 2.00 0.35 - 4.50 uIU/mL

## 2020-12-21 ENCOUNTER — Other Ambulatory Visit: Payer: Self-pay

## 2020-12-21 ENCOUNTER — Ambulatory Visit: Payer: Medicare Other | Attending: Internal Medicine

## 2020-12-21 ENCOUNTER — Ambulatory Visit (INDEPENDENT_AMBULATORY_CARE_PROVIDER_SITE_OTHER): Payer: Medicare Other | Admitting: Family Medicine

## 2020-12-21 ENCOUNTER — Encounter: Payer: Self-pay | Admitting: Family Medicine

## 2020-12-21 ENCOUNTER — Other Ambulatory Visit (HOSPITAL_BASED_OUTPATIENT_CLINIC_OR_DEPARTMENT_OTHER): Payer: Self-pay

## 2020-12-21 VITALS — BP 154/82 | HR 72 | Resp 17 | Ht 67.0 in | Wt 194.0 lb

## 2020-12-21 DIAGNOSIS — Z23 Encounter for immunization: Secondary | ICD-10-CM

## 2020-12-21 DIAGNOSIS — E785 Hyperlipidemia, unspecified: Secondary | ICD-10-CM | POA: Diagnosis not present

## 2020-12-21 DIAGNOSIS — D472 Monoclonal gammopathy: Secondary | ICD-10-CM

## 2020-12-21 DIAGNOSIS — Z131 Encounter for screening for diabetes mellitus: Secondary | ICD-10-CM | POA: Diagnosis not present

## 2020-12-21 DIAGNOSIS — R030 Elevated blood-pressure reading, without diagnosis of hypertension: Secondary | ICD-10-CM | POA: Diagnosis not present

## 2020-12-21 DIAGNOSIS — E049 Nontoxic goiter, unspecified: Secondary | ICD-10-CM | POA: Diagnosis not present

## 2020-12-21 LAB — COMPREHENSIVE METABOLIC PANEL
ALT: 15 U/L (ref 0–35)
AST: 16 U/L (ref 0–37)
Albumin: 3.8 g/dL (ref 3.5–5.2)
Alkaline Phosphatase: 46 U/L (ref 39–117)
BUN: 12 mg/dL (ref 6–23)
CO2: 25 mEq/L (ref 19–32)
Calcium: 8.7 mg/dL (ref 8.4–10.5)
Chloride: 110 mEq/L (ref 96–112)
Creatinine, Ser: 0.83 mg/dL (ref 0.40–1.20)
GFR: 67.48 mL/min (ref >60.00)
Glucose, Bld: 92 mg/dL (ref 70–99)
Potassium: 4.1 mEq/L (ref 3.5–5.1)
Sodium: 143 mEq/L (ref 135–145)
Total Bilirubin: 0.5 mg/dL (ref 0.2–1.2)
Total Protein: 6.7 g/dL (ref 6.0–8.3)

## 2020-12-21 LAB — CBC
HCT: 35.1 % — ABNORMAL LOW (ref 36.0–46.0)
Hemoglobin: 11.5 g/dL — ABNORMAL LOW (ref 12.0–15.0)
MCHC: 32.9 g/dL (ref 30.0–36.0)
MCV: 90.4 fl (ref 78.0–100.0)
Platelets: 159 10*3/uL (ref 150.0–400.0)
RBC: 3.88 Mil/uL (ref 3.87–5.11)
RDW: 13.8 % (ref 11.5–15.5)
WBC: 3.8 10*3/uL — ABNORMAL LOW (ref 4.0–10.5)

## 2020-12-21 LAB — TSH: TSH: 2 u[IU]/mL (ref 0.35–4.50)

## 2020-12-21 LAB — LIPID PANEL
Cholesterol: 130 mg/dL (ref 0–200)
HDL: 54.1 mg/dL (ref >39.00)
LDL Cholesterol: 65 mg/dL (ref 0–99)
NonHDL: 75.61
Total CHOL/HDL Ratio: 2
Triglycerides: 55 mg/dL (ref 0.0–149.0)
VLDL: 11 mg/dL (ref 0.0–40.0)

## 2020-12-21 MED ORDER — SHINGRIX 50 MCG/0.5ML IM SUSR
INTRAMUSCULAR | 1 refills | Status: DC
  Filled 2020-12-21: qty 1, 1d supply, fill #0

## 2020-12-21 MED ORDER — ROSUVASTATIN CALCIUM 10 MG PO TABS: 10.0000 mg | ORAL_TABLET | Freq: Every day | ORAL | 3 refills | Status: DC

## 2020-12-22 ENCOUNTER — Other Ambulatory Visit (HOSPITAL_BASED_OUTPATIENT_CLINIC_OR_DEPARTMENT_OTHER): Payer: Self-pay

## 2020-12-22 MED ORDER — PFIZER-BIONT COVID-19 VAC-TRIS 30 MCG/0.3ML IM SUSP
INTRAMUSCULAR | 0 refills | Status: DC
  Filled 2020-12-22: qty 0.3, 1d supply, fill #0

## 2020-12-25 ENCOUNTER — Other Ambulatory Visit: Payer: Self-pay

## 2020-12-25 ENCOUNTER — Ambulatory Visit (HOSPITAL_BASED_OUTPATIENT_CLINIC_OR_DEPARTMENT_OTHER)
Admission: RE | Admit: 2020-12-25 | Discharge: 2020-12-25 | Disposition: A | Discharge status: Home / Self Care | Payer: Medicare Other | Source: Ambulatory Visit | Attending: Family Medicine | Admitting: Family Medicine

## 2020-12-25 ENCOUNTER — Encounter: Payer: Self-pay | Admitting: Family Medicine

## 2020-12-25 DIAGNOSIS — E042 Nontoxic multinodular goiter: Secondary | ICD-10-CM | POA: Diagnosis not present

## 2020-12-25 DIAGNOSIS — E049 Nontoxic goiter, unspecified: Secondary | ICD-10-CM | POA: Diagnosis not present

## 2021-01-20 ENCOUNTER — Other Ambulatory Visit: Payer: Self-pay | Admitting: Family Medicine

## 2021-01-20 DIAGNOSIS — N644 Mastodynia: Secondary | ICD-10-CM

## 2021-01-20 DIAGNOSIS — M25562 Pain in left knee: Secondary | ICD-10-CM

## 2021-01-20 DIAGNOSIS — M2550 Pain in unspecified joint: Secondary | ICD-10-CM

## 2021-02-26 DIAGNOSIS — H401132 Primary open-angle glaucoma, bilateral, moderate stage: Secondary | ICD-10-CM | POA: Diagnosis not present

## 2021-05-04 ENCOUNTER — Ambulatory Visit (HOSPITAL_COMMUNITY)
Admission: EM | Admit: 2021-05-04 | Discharge: 2021-05-04 | Disposition: A | Discharge status: Home / Self Care | Payer: Medicare Other | Attending: Student | Admitting: Student

## 2021-05-04 ENCOUNTER — Other Ambulatory Visit: Payer: Self-pay

## 2021-05-04 ENCOUNTER — Encounter (HOSPITAL_COMMUNITY): Payer: Self-pay | Admitting: Emergency Medicine

## 2021-05-04 DIAGNOSIS — N898 Other specified noninflammatory disorders of vagina: Secondary | ICD-10-CM

## 2021-05-04 DIAGNOSIS — R030 Elevated blood-pressure reading, without diagnosis of hypertension: Secondary | ICD-10-CM | POA: Insufficient documentation

## 2021-05-04 LAB — POCT URINALYSIS DIPSTICK, ED / UC
Bilirubin Urine: NEGATIVE
Glucose, UA: NEGATIVE mg/dL
Ketones, ur: NEGATIVE mg/dL
Nitrite: NEGATIVE
Protein, ur: NEGATIVE mg/dL
Specific Gravity, Urine: 1.015 (ref 1.005–1.030)
Urobilinogen, UA: 0.2 mg/dL (ref 0.0–1.0)
pH: 5.5 (ref 5.0–8.0)

## 2021-05-04 NOTE — ED Triage Notes (Signed)
Pt c/o vaginal pain (burning on outside) for 2 weeks. Denies discharge or drainage or dysuria.

## 2021-05-04 NOTE — ED Provider Notes (Signed)
[place]    CSN: [id number] Arrival date & time: 05/04/21  5329      History   Chief Complaint Chief Complaint  Patient presents with   Vaginal Pain    HPI Tara Jimenez is a 80 y.o. female presenting with vaginitis. Medical history noncontributory, though reports distant history BV.  Describes external vaginal irritation for about 2 weeks, somewhat improved with over-the-counter Monistat.  Denies any other symptoms including discharge, lesions, abscesses, abdominal pain, fever/chills, flank pain, urinary frequency, hematuria.  Denies new partners.  Denies changes in routine like new products or fragrance.  HPI  Past Medical History:  Diagnosis Date   Arthritis    Glaucoma    Hyperlipidemia     Patient Active Problem List   Diagnosis Date Noted   Dyslipidemia 12/13/2019   Right foot drop 08/03/2016   Borderline blood pressure 08/03/2016   MGUS (monoclonal gammopathy of unknown significance) 08/17/2015   Leukopenia 07/22/2015   Paresthesias 07/22/2015   Bone pain 07/22/2015   Microscopic hematuria 02/06/2015   Subdural hematoma ([place]) 02/19/2010   OBESITY 02/15/2010   GERD 02/15/2010   ARTHRITIS 02/15/2010   UTI'S, HX OF 02/15/2010    Past Surgical History:  Procedure Laterality Date   BRAIN SURGERY     COLONOSCOPY  2011    OB History   No obstetric history on file.      Home Medications    Prior to Admission medications   Medication Sig Start Date End Date Taking? Authorizing Provider  aspirin EC 81 MG tablet Take 81 mg by mouth daily.    [provider]  COVID-19 mRNA Vac-TriS, Pfizer, (PFIZER-BIONT COVID-19 VAC-TRIS) SUSP injection Inject into the muscle. 12/21/20   Carlyle Basques, MD  latanoprost (XALATAN) 0.005 % ophthalmic solution Place 1 drop into both eyes at bedtime.  11/07/14   [provider]  meloxicam (MOBIC) 7.5 MG tablet TAKE 1 TABLET (7.5 MG TOTAL) BY MOUTH DAILY. USE AS NEEDED FOR PAIN 01/20/21   Copland,  Gay Filler, MD  rosuvastatin (CRESTOR) 10 MG tablet Take 1 tablet (10 mg total) by mouth daily. 12/21/20   Copland, Gay Filler, MD  Zoster Vaccine Adjuvanted [facility]) injection Inject into the muscle. 12/21/20   Carlyle Basques, MD    Family History Family History  Problem Relation Age of Onset   Colitis Sister 64   Diabetes Brother    Heart disease Brother    Hypertension Brother    Aneurysm Brother    Breast cancer Neg Hx    Colon cancer Neg Hx    Esophageal cancer Neg Hx    Rectal cancer Neg Hx    Stomach cancer Neg Hx     Social History Social History   Tobacco Use   Smoking status: Never   Smokeless tobacco: Never  Vaping Use   Vaping Use: Never used  Substance Use Topics   Alcohol use: No    Alcohol/week: 0.0 standard drinks   Drug use: No     Allergies   Patient has no known allergies.   Review of Systems Review of Systems  Constitutional:  Negative for appetite change, chills, diaphoresis and fever.  Respiratory:  Negative for shortness of breath.   Cardiovascular:  Negative for chest pain.  Gastrointestinal:  Negative for abdominal pain, blood in stool, constipation, diarrhea, nausea and vomiting.  Genitourinary:  Positive for vaginal pain. Negative for decreased urine volume, difficulty urinating, dysuria, flank pain, frequency, genital sores, hematuria, menstrual problem, pelvic pain, urgency, vaginal  bleeding and vaginal discharge.  Musculoskeletal:  Negative for back pain.  Neurological:  Negative for dizziness, weakness and light-headedness.  All other systems reviewed and are negative.   Physical Exam Triage Vital Signs ED Triage Vitals  Enc Vitals Group     BP 05/04/21 0919 (!) 187/79     Pulse Rate 05/04/21 0919 72     Resp 05/04/21 0919 18     Temp 05/04/21 0919 98.7 F (37.1 C)     Temp Source 05/04/21 0919 Oral     SpO2 05/04/21 0919 97 %     Weight --      Height --      Head Circumference --      Peak Flow --      Pain Score  05/04/21 0918 5     Pain Loc --      Pain Edu? --      Excl. in [place]? --    No data found.  Updated Vital Signs BP (!) 187/79 (BP Location: Left Arm)   Pulse 72   Temp 98.7 F (37.1 C) (Oral)   Resp 18   SpO2 97%   Visual Acuity Right Eye Distance:   Left Eye Distance:   Bilateral Distance:    Right Eye Near:   Left Eye Near:    Bilateral Near:     Physical Exam Vitals reviewed.  Constitutional:      General: She is not in acute distress.    Appearance: Normal appearance. She is not ill-appearing.  HENT:     Head: Normocephalic and atraumatic.     Mouth/Throat:     Mouth: Mucous membranes are moist.     Comments: Moist mucous membranes Eyes:     Extraocular Movements: Extraocular movements intact.     Pupils: Pupils are equal, round, and reactive to light.  Cardiovascular:     Rate and Rhythm: Normal rate and regular rhythm.     Heart sounds: Normal heart sounds.  Pulmonary:     Effort: Pulmonary effort is normal.     Breath sounds: Normal breath sounds. No wheezing, rhonchi or rales.  Abdominal:     General: Bowel sounds are normal. There is no distension.     Palpations: Abdomen is soft. There is no mass.     Tenderness: There is no abdominal tenderness. There is no right CVA tenderness, left CVA tenderness, guarding or rebound.  Genitourinary:    Comments: deferred Skin:    General: Skin is warm.     Capillary Refill: Capillary refill takes less than 2 seconds.     Comments: Good skin turgor  Neurological:     General: No focal deficit present.     Mental Status: She is alert and oriented to person, place, and time.  Psychiatric:        Mood and Affect: Mood normal.        Behavior: Behavior normal.     UC Treatments / Results  Labs (all labs ordered are listed, but only abnormal results are displayed) Labs Reviewed - No data to display  EKG   Radiology No results found.  Procedures Procedures (including critical care time)  Medications  Ordered in UC Medications - No data to display  Initial Impression / Assessment and Plan / UC Course  I have reviewed the triage vital signs and the nursing notes.  Pertinent labs & imaging results that were available during my care of the patient were reviewed by me and considered in  my medical decision making (see chart for details).     This patient is a very pleasant 80 y.o. year old female presenting with vaginal irritation. Afebrile, nontachycardic, no reproducible abd pain or CVAT.  UA with trace blood and trace leuk, did not send culture. Denies STI risk. Will send self-swab for G/C, trich, yeast, BV testing. Declines HIV, RPR. Safe sex precautions.   Continue OTC monistat while awaiting test results.   F/u with PCP as vaginal atrophy could explain symptoms.   For elevated BP, continue to monitor at home. She does not take medications for this. Denies headaches, chest pain, shortness of breath, dizziness, vision changes.  ED return precautions discussed. Patient verbalizes understanding and agreement.    Final Clinical Impressions(s) / UC Diagnoses   Final diagnoses:  Vaginal irritation  Elevated blood pressure reading without diagnosis of hypertension     Discharge Instructions      -Continue Monistat while awaiting test results  -We'll call in about 2 days if anything positive -Follow-up with PCP if symptoms persist -Please check your blood pressure at home or at the pharmacy. If this continues to be >140/90, follow-up with your primary care provider for further blood pressure management/ medication titration. If you develop chest pain, shortness of breath, vision changes, the worst headache of your life- head straight to the ED or call 911.    ED Prescriptions   None    PDMP not reviewed this encounter.   Hazel Sams, PA-C 05/04/21 1057

## 2021-05-04 NOTE — Discharge Instructions (Addendum)
-  Continue Monistat while awaiting test results  -We'll call in about 2 days if anything positive -Follow-up with PCP if symptoms persist -Please check your blood pressure at home or at the pharmacy. If this continues to be >140/90, follow-up with your primary care provider for further blood pressure management/ medication titration. If you develop chest pain, shortness of breath, vision changes, the worst headache of your life- head straight to the ED or call 911.

## 2021-05-05 LAB — CERVICOVAGINAL ANCILLARY ONLY
Bacterial Vaginitis (gardnerella): POSITIVE — AB
Candida Glabrata: POSITIVE — AB
Candida Vaginitis: NEGATIVE
Chlamydia: NEGATIVE
Comment: NEGATIVE
Comment: NEGATIVE
Comment: NEGATIVE
Comment: NEGATIVE
Comment: NEGATIVE
Comment: NORMAL
Neisseria Gonorrhea: NEGATIVE
Trichomonas: NEGATIVE

## 2021-05-06 ENCOUNTER — Telehealth (HOSPITAL_COMMUNITY): Payer: Self-pay | Admitting: Emergency Medicine

## 2021-05-06 MED ORDER — METRONIDAZOLE 500 MG PO TABS: 500.0000 mg | ORAL_TABLET | Freq: Two times a day (BID) | ORAL | 0 refills | Status: DC

## 2021-05-06 MED ORDER — FLUCONAZOLE 150 MG PO TABS: 150.0000 mg | ORAL_TABLET | Freq: Once | ORAL | 0 refills | Status: AC

## 2021-05-10 ENCOUNTER — Other Ambulatory Visit: Payer: Self-pay | Admitting: Family Medicine

## 2021-05-10 DIAGNOSIS — Z1231 Encounter for screening mammogram for malignant neoplasm of breast: Secondary | ICD-10-CM

## 2021-05-12 ENCOUNTER — Other Ambulatory Visit: Payer: Self-pay

## 2021-05-12 ENCOUNTER — Ambulatory Visit: Payer: Medicare Other

## 2021-05-12 ENCOUNTER — Ambulatory Visit (INDEPENDENT_AMBULATORY_CARE_PROVIDER_SITE_OTHER): Payer: Medicare Other

## 2021-05-12 VITALS — BP 160/80 | HR 71 | Temp 98.8°F | Resp 16 | Ht 67.0 in | Wt 191.8 lb

## 2021-05-12 DIAGNOSIS — Z0000 Encounter for general adult medical examination without abnormal findings: Secondary | ICD-10-CM

## 2021-05-12 NOTE — Patient Instructions (Signed)
Tara Jimenez , Thank you for taking time to come for your Medicare Wellness Visit. I appreciate your ongoing commitment to your health goals. Please review the following plan we discussed and let me know if I can assist you in the future.   Screening recommendations/referrals: Colonoscopy: No longer required Mammogram: Completed 05/26/2020. Scheduled for 06/10/2021 Bone Density: Declined. Please call the office to schedule if you change your mind. Recommended yearly ophthalmology/optometry visit for glaucoma screening and checkup Recommended yearly dental visit for hygiene and checkup  Vaccinations: Influenza vaccine: Due-May obtain vaccine at our office or your local pharmacy. Pneumococcal vaccine: Up to date Tdap vaccine: Up to date-Due-11/09/2024 Shingles vaccine: Completed vaccines   Covid-19:Booster available at pharmacy.  Advanced directives: Information given today.  Conditions/risks identified: See problem list  Next appointment: Follow up in one year for your annual wellness visit 10/79/2023 @ 9:40.   Preventive Care 79 Years and Older, Female Preventive care refers to lifestyle choices and visits with your health care provider that can promote health and wellness. What does preventive care include? A yearly physical exam. This is also called an annual well check. Dental exams once or twice a year. Routine eye exams. Ask your health care provider how often you should have your eyes checked. Personal lifestyle choices, including: Daily care of your teeth and gums. Regular physical activity. Eating a healthy diet. Avoiding tobacco and drug use. Limiting alcohol use. Practicing safe sex. Taking low-dose aspirin every day. Taking vitamin and mineral supplements as recommended by your health care provider. What happens during an annual well check? The services and screenings done by your health care provider during your annual well check will depend on your age, overall health,  lifestyle risk factors, and family history of disease. Counseling  Your health care provider may ask you questions about your: Alcohol use. Tobacco use. Drug use. Emotional well-being. Home and relationship well-being. Sexual activity. Eating habits. History of falls. Memory and ability to understand (cognition). Work and work Statistician. Reproductive health. Screening  You may have the following tests or measurements: Height, weight, and BMI. Blood pressure. Lipid and cholesterol levels. These may be checked every 5 years, or more frequently if you are over 84 years old. Skin check. Lung cancer screening. You may have this screening every year starting at age 50 if you have a 30-pack-year history of smoking and currently smoke or have quit within the past 15 years. Fecal occult blood test (FOBT) of the stool. You may have this test every year starting at age 4. Flexible sigmoidoscopy or colonoscopy. You may have a sigmoidoscopy every 5 years or a colonoscopy every 10 years starting at age 87. Hepatitis C blood test. Hepatitis B blood test. Sexually transmitted disease (STD) testing. Diabetes screening. This is done by checking your blood sugar (glucose) after you have not eaten for a while (fasting). You may have this done every 1-3 years. Bone density scan. This is done to screen for osteoporosis. You may have this done starting at age 26. Mammogram. This may be done every 1-2 years. Talk to your health care provider about how often you should have regular mammograms. Talk with your health care provider about your test results, treatment options, and if necessary, the need for more tests. Vaccines  Your health care provider may recommend certain vaccines, such as: Influenza vaccine. This is recommended every year. Tetanus, diphtheria, and acellular pertussis (Tdap, Td) vaccine. You may need a Td booster every 10 years. Zoster vaccine. You may  need this after age  41. Pneumococcal 13-valent conjugate (PCV13) vaccine. One dose is recommended after age 56. Pneumococcal polysaccharide (PPSV23) vaccine. One dose is recommended after age 6. Talk to your health care provider about which screenings and vaccines you need and how often you need them. This information is not intended to replace advice given to you by your health care provider. Make sure you discuss any questions you have with your health care provider. Document Released: 08/21/2015 Document Revised: 04/13/2016 Document Reviewed: 05/26/2015 Elsevier Interactive Patient Education  2017 [place] Prevention in the Home Falls can cause injuries. They can happen to people of all ages. There are many things you can do to make your home safe and to help prevent falls. What can I do on the outside of my home? Regularly fix the edges of walkways and driveways and fix any cracks. Remove anything that might make you trip as you walk through a door, such as a raised step or threshold. Trim any bushes or trees on the path to your home. Use bright outdoor lighting. Clear any walking paths of anything that might make someone trip, such as rocks or tools. Regularly check to see if handrails are loose or broken. Make sure that both sides of any steps have handrails. Any raised decks and porches should have guardrails on the edges. Have any leaves, snow, or ice cleared regularly. Use sand or salt on walking paths during winter. Clean up any spills in your garage right away. This includes oil or grease spills. What can I do in the bathroom? Use night lights. Install grab bars by the toilet and in the tub and shower. Do not use towel bars as grab bars. Use non-skid mats or decals in the tub or shower. If you need to sit down in the shower, use a plastic, non-slip stool. Keep the floor dry. Clean up any water that spills on the floor as soon as it happens. Remove soap buildup in the tub or shower  regularly. Attach bath mats securely with double-sided non-slip rug tape. Do not have throw rugs and other things on the floor that can make you trip. What can I do in the bedroom? Use night lights. Make sure that you have a light by your bed that is easy to reach. Do not use any sheets or blankets that are too big for your bed. They should not hang down onto the floor. Have a firm chair that has side arms. You can use this for support while you get dressed. Do not have throw rugs and other things on the floor that can make you trip. What can I do in the kitchen? Clean up any spills right away. Avoid walking on wet floors. Keep items that you use a lot in easy-to-reach places. If you need to reach something above you, use a strong step stool that has a grab bar. Keep electrical cords out of the way. Do not use floor polish or wax that makes floors slippery. If you must use wax, use non-skid floor wax. Do not have throw rugs and other things on the floor that can make you trip. What can I do with my stairs? Do not leave any items on the stairs. Make sure that there are handrails on both sides of the stairs and use them. Fix handrails that are broken or loose. Make sure that handrails are as long as the stairways. Check any carpeting to make sure that it is firmly attached  to the stairs. Fix any carpet that is loose or worn. Avoid having throw rugs at the top or bottom of the stairs. If you do have throw rugs, attach them to the floor with carpet tape. Make sure that you have a light switch at the top of the stairs and the bottom of the stairs. If you do not have them, ask someone to add them for you. What else can I do to help prevent falls? Wear shoes that: Do not have high heels. Have rubber bottoms. Are comfortable and fit you well. Are closed at the toe. Do not wear sandals. If you use a stepladder: Make sure that it is fully opened. Do not climb a closed stepladder. Make sure that  both sides of the stepladder are locked into place. Ask someone to hold it for you, if possible. Clearly mark and make sure that you can see: Any grab bars or handrails. First and last steps. Where the edge of each step is. Use tools that help you move around (mobility aids) if they are needed. These include: Canes. Walkers. Scooters. Crutches. Turn on the lights when you go into a dark area. Replace any light bulbs as soon as they burn out. Set up your furniture so you have a clear path. Avoid moving your furniture around. If any of your floors are uneven, fix them. If there are any pets around you, be aware of where they are. Review your medicines with your doctor. Some medicines can make you feel dizzy. This can increase your chance of falling. Ask your doctor what other things that you can do to help prevent falls. This information is not intended to replace advice given to you by your health care provider. Make sure you discuss any questions you have with your health care provider. Document Released: 05/21/2009 Document Revised: 12/31/2015 Document Reviewed: 08/29/2014 Elsevier Interactive Patient Education  2017 [employer].

## 2021-05-12 NOTE — Progress Notes (Signed)
Subjective:   Tamaya Pun is a 80 y.o. female who presents for an Initial Medicare Annual Wellness Visit.  Review of Systems     Cardiac Risk Factors include: advanced age (>59men, >71 women);dyslipidemia;obesity (BMI >30kg/m2)     Objective:    Today's Vitals   05/12/21 0937 05/12/21 0958  BP: (!) 170/90 (!) 160/80  Pulse: 71   Resp: 16   Temp: 98.8 F (37.1 C)   TempSrc: Temporal   SpO2: 97%   Weight: 191 lb 12.8 oz (87 kg)   Height: 5\' 7"  (1.702 m)    Body mass index is 30.04 kg/m.  Advanced Directives 05/12/2021 07/22/2015  Does Patient Have a Medical Advance Directive? No No  Would patient like information on creating a medical advance directive? Yes (MAU/Ambulatory/Procedural Areas - Information given) No - patient declined information    Current Medications (verified) Outpatient Encounter Medications as of 05/12/2021  Medication Sig   aspirin EC 81 MG tablet Take 81 mg by mouth daily.   fluconazole (DIFLUCAN) 150 MG tablet Take 150 mg by mouth every 3 (three) days.   latanoprost (XALATAN) 0.005 % ophthalmic solution Place 1 drop into both eyes at bedtime.    meloxicam (MOBIC) 7.5 MG tablet TAKE 1 TABLET (7.5 MG TOTAL) BY MOUTH DAILY. USE AS NEEDED FOR PAIN   metroNIDAZOLE (FLAGYL) 500 MG tablet Take 1 tablet (500 mg total) by mouth 2 (two) times daily.   rosuvastatin (CRESTOR) 10 MG tablet Take 1 tablet (10 mg total) by mouth daily.   COVID-19 mRNA Vac-TriS, Pfizer, (PFIZER-BIONT COVID-19 VAC-TRIS) SUSP injection Inject into the muscle. (Patient not taking: Reported on 05/12/2021)   Zoster Vaccine Adjuvanted [facility]) injection Inject into the muscle. (Patient not taking: Reported on 05/12/2021)   Facility-Administered Encounter Medications as of 05/12/2021  Medication   0.9 %  sodium chloride infusion    Allergies (verified) Patient has no known allergies.   History: Past Medical History:  Diagnosis Date   Arthritis    Glaucoma    Hyperlipidemia     Past Surgical History:  Procedure Laterality Date   BRAIN SURGERY     COLONOSCOPY  2011   Family History  Problem Relation Age of Onset   Colitis Sister 31   Diabetes Brother    Heart disease Brother    Hypertension Brother    Aneurysm Brother    Breast cancer Neg Hx    Colon cancer Neg Hx    Esophageal cancer Neg Hx    Rectal cancer Neg Hx    Stomach cancer Neg Hx    Social History   Socioeconomic History   Marital status: Single    Spouse name: Not on file   Number of children: Not on file   Years of education: Not on file   Highest education level: Not on file  Occupational History   Not on file  Tobacco Use   Smoking status: Never   Smokeless tobacco: Never  Vaping Use   Vaping Use: Never used  Substance and Sexual Activity   Alcohol use: No    Alcohol/week: 0.0 standard drinks   Drug use: No   Sexual activity: Not on file  Other Topics Concern   Not on file  Social History Narrative   Not on file   Social Determinants of Health   Financial Resource Strain: Low Risk    Difficulty of Paying Living Expenses: Not hard at all  Food Insecurity: No Food Insecurity   Worried About Running Out  of Food in the Last Year: Never true   [place] in the Last Year: Never true  Transportation Needs: No Transportation Needs   Lack of Transportation (Medical): No   Lack of Transportation (Non-Medical): No  Physical Activity: Insufficiently Active   Days of Exercise per Week: 7 days   Minutes of Exercise per Session: 10 min  Stress: No Stress Concern Present   Feeling of Stress : Not at all  Social Connections: Moderately Isolated   Frequency of Communication with Friends and Family: More than three times a week   Frequency of Social Gatherings with Friends and Family: More than three times a week   Attends Religious Services: More than 4 times per year   Active Member of [employer] or Organizations: No   Attends Music therapist: Never   Marital  Status: Never married    Tobacco Counseling Counseling given: Not Answered   Clinical Intake:  Pre-visit preparation completed: Yes  Pain : No/denies pain     BMI - recorded: 30.04 Nutritional Status: BMI > 30  Obese Nutritional Risks: None Diabetes: No  How often do you need to have someone help you when you read instructions, pamphlets, or other written materials from your doctor or pharmacy?: 1 - Never  Diabetic?No  Interpreter Needed?: No  Information entered by :: Caroleen Hamman LPN   Activities of Daily Living In your present state of health, do you have any difficulty performing the following activities: 05/12/2021 12/21/2020  Hearing? N N  Vision? N N  Difficulty concentrating or making decisions? N N  Walking or climbing stairs? N N  Dressing or bathing? N N  Doing errands, shopping? N N  Preparing Food and eating ? N -  Using the Toilet? N -  In the past six months, have you accidently leaked urine? N -  Do you have problems with loss of bowel control? N -  Managing your Medications? N -  Managing your Finances? N -  Housekeeping or managing your Housekeeping? N -  Some recent data might be hidden    Patient Care Team: Copland, Gay Filler, MD as PCP - General (Family Medicine) Marygrace Drought, MD as Consulting Physician (Ophthalmology)  Indicate any recent Medical Services you may have received from other than [facility] providers in the past year (date may be approximate).     Assessment:   This is a routine wellness examination for [facility].  Hearing/Vision screen Hearing Screening - Comments:: No issues Vision Screening - Comments:: Last eye exam-09/2020-  Dietary issues and exercise activities discussed: Current Exercise Habits: Home exercise routine, Type of exercise: walking, Time (Minutes): 10, Intensity: Mild, Exercise limited by: None identified   Goals Addressed             This Visit's Progress    Patient Stated       Maintain current  health       Depression Screen PHQ 2/9 Scores 05/12/2021 12/21/2020 03/22/2017 08/03/2016 06/24/2015 12/13/2014 11/10/2014  PHQ - 2 Score 1 0 0 0 0 0 0  Exception Documentation - - Patient refusal - - - -    Fall Risk Fall Risk  05/12/2021 12/21/2020 07/02/2019 06/25/2018 03/22/2017  Falls in the past year? 0 0 0 0 No  Comment - - Emmi Telephone Survey: data to providers prior to load [employer] Telephone Survey: data to providers prior to load -  Number falls in past yr: 0 - - - -  Injury with Fall? 0 - - - -  Follow up Falls prevention discussed - - - -    FALL RISK PREVENTION PERTAINING TO THE HOME:  Any stairs in or around the home? No  Home free of loose throw rugs in walkways, pet beds, electrical cords, etc? Yes  Adequate lighting in your home to reduce risk of falls? Yes   ASSISTIVE DEVICES UTILIZED TO PREVENT FALLS:  Life alert? No  Use of a cane, walker or w/c? No  Grab bars in the bathroom? Yes  Shower chair or bench in shower? No  Elevated toilet seat or a handicapped toilet? No   TIMED UP AND GO:  Was the test performed? Yes .  Length of time to ambulate 10 feet: 10 sec.   Gait steady and fast without use of assistive device  Cognitive Function:        Immunizations Immunization History  Administered Date(s) Administered   Fluad Quad(high Dose 65+) 06/22/2020   Influenza Split 06/08/2016   Influenza, High Dose Seasonal PF 07/08/2017, 06/08/2018   Influenza,inj,Quad PF,6+ Mos 06/24/2015   PFIZER Comirnaty(Gray Top)Covid-19 Tri-Sucrose Vaccine 12/21/2020   PFIZER(Purple Top)SARS-COV-2 Vaccination 09/29/2019, 10/23/2019, 05/29/2020   Pneumococcal Conjugate-13 11/10/2014   Pneumococcal Polysaccharide-23 09/18/2017   Td 11/10/2014    TDAP status: Up to date  Flu Vaccine status: Due, Education has been provided regarding the importance of this vaccine. Advised may receive this vaccine at local pharmacy or Health Dept. Aware to provide a copy of the vaccination  record if obtained from local pharmacy or Health Dept. Verbalized acceptance and understanding.  Pneumococcal vaccine status: Up to date  Covid-19 vaccine status: Information provided on how to obtain vaccines. Booster due  Qualifies for Shingles Vaccine? Yes   Zostavax completed No   Shingrix Completed?: No.    Education has been provided regarding the importance of this vaccine. Patient has been advised to call insurance company to determine out of pocket expense if they have not yet received this vaccine. Advised may also receive vaccine at local pharmacy or Health Dept. Verbalized acceptance and understanding.  Screening Tests Health Maintenance  Topic Date Due   Zoster Vaccines- Shingrix (1 of 2) Never done   INFLUENZA VACCINE  03/08/2021   COVID-19 Vaccine (5 - Booster for Pfizer series) 04/23/2021   TETANUS/TDAP  11/09/2024   DEXA SCAN  Completed   Hepatitis C Screening  Completed   HPV VACCINES  Aged Out    Health Maintenance  Health Maintenance Due  Topic Date Due   Zoster Vaccines- Shingrix (1 of 2) Never done   INFLUENZA VACCINE  03/08/2021   COVID-19 Vaccine (5 - Booster for [place] series) 04/23/2021    Colorectal cancer screening: No longer required.   Mammogram status: Completed bilateral 05/26/2020. Repeat every yearScheduled for 06/10/2021  Bone Density status: Declined  Lung Cancer Screening: (Low Dose CT Chest recommended if Age 78-80 years, 30 pack-year currently smoking OR have quit w/in 15years.) does not qualify.     Additional Screening:  Hepatitis C Screening: does not qualify  Vision Screening: Recommended annual ophthalmology exams for early detection of glaucoma and other disorders of the eye. Is the patient up to date with their annual eye exam?  Yes  Who is the provider or what is the name of the office in which the patient attends annual eye exams? Dr. Satira Sark  Dental Screening: Recommended annual dental exams for proper oral  hygiene  Community Resource Referral / Chronic Care Management: CRR required this visit?  No   CCM required this  visit?  No      Plan:     I have personally reviewed and noted the following in the patient's chart:   Medical and social history Use of alcohol, tobacco or illicit drugs  Current medications and supplements including opioid prescriptions. Patient is not currently taking opioid prescriptions. Functional ability and status Nutritional status Physical activity Advanced directives List of other physicians Hospitalizations, surgeries, and ER visits in previous 12 months Vitals Screenings to include cognitive, depression, and falls Referrals and appointments  In addition, I have reviewed and discussed with patient certain preventive protocols, quality metrics, and best practice recommendations. A written personalized care plan for preventive services as well as general preventive health recommendations were provided to patient.   Patient to access avs on mychart.  Marta Antu, LPN   70/[id number]/1749  Nurse Health Advisor  Nurse Notes: None

## 2021-05-24 ENCOUNTER — Other Ambulatory Visit: Payer: Self-pay | Admitting: Family Medicine

## 2021-05-24 DIAGNOSIS — M2550 Pain in unspecified joint: Secondary | ICD-10-CM

## 2021-05-24 DIAGNOSIS — N644 Mastodynia: Secondary | ICD-10-CM

## 2021-05-24 DIAGNOSIS — M25562 Pain in left knee: Secondary | ICD-10-CM

## 2021-06-07 DIAGNOSIS — H25013 Cortical age-related cataract, bilateral: Secondary | ICD-10-CM | POA: Diagnosis not present

## 2021-06-07 DIAGNOSIS — H401132 Primary open-angle glaucoma, bilateral, moderate stage: Secondary | ICD-10-CM | POA: Diagnosis not present

## 2021-06-07 DIAGNOSIS — H524 Presbyopia: Secondary | ICD-10-CM | POA: Diagnosis not present

## 2021-06-07 DIAGNOSIS — H2513 Age-related nuclear cataract, bilateral: Secondary | ICD-10-CM | POA: Diagnosis not present

## 2021-06-10 ENCOUNTER — Other Ambulatory Visit: Payer: Self-pay

## 2021-06-10 ENCOUNTER — Ambulatory Visit
Admission: RE | Admit: 2021-06-10 | Discharge: 2021-06-10 | Disposition: A | Discharge status: Home / Self Care | Payer: Medicare Other | Source: Ambulatory Visit | Attending: Family Medicine | Admitting: Family Medicine

## 2021-06-10 DIAGNOSIS — Z1231 Encounter for screening mammogram for malignant neoplasm of breast: Secondary | ICD-10-CM

## 2021-06-11 ENCOUNTER — Other Ambulatory Visit: Payer: Self-pay | Admitting: Family Medicine

## 2021-06-11 DIAGNOSIS — R928 Other abnormal and inconclusive findings on diagnostic imaging of breast: Secondary | ICD-10-CM

## 2021-06-16 ENCOUNTER — Encounter: Payer: Self-pay | Admitting: Family Medicine

## 2021-06-20 ENCOUNTER — Other Ambulatory Visit: Payer: Self-pay | Admitting: Family Medicine

## 2021-06-20 DIAGNOSIS — N644 Mastodynia: Secondary | ICD-10-CM

## 2021-06-20 DIAGNOSIS — M25562 Pain in left knee: Secondary | ICD-10-CM

## 2021-06-20 DIAGNOSIS — M2550 Pain in unspecified joint: Secondary | ICD-10-CM

## 2021-06-21 ENCOUNTER — Encounter: Payer: Self-pay | Admitting: Family Medicine

## 2021-07-13 ENCOUNTER — Ambulatory Visit: Payer: Medicare Other

## 2021-07-13 ENCOUNTER — Ambulatory Visit
Admission: RE | Admit: 2021-07-13 | Discharge: 2021-07-13 | Disposition: A | Discharge status: Home / Self Care | Payer: Medicare Other | Source: Ambulatory Visit | Attending: Family Medicine | Admitting: Family Medicine

## 2021-07-13 DIAGNOSIS — R928 Other abnormal and inconclusive findings on diagnostic imaging of breast: Secondary | ICD-10-CM

## 2021-07-13 DIAGNOSIS — R922 Inconclusive mammogram: Secondary | ICD-10-CM | POA: Diagnosis not present

## 2021-07-19 DIAGNOSIS — H401132 Primary open-angle glaucoma, bilateral, moderate stage: Secondary | ICD-10-CM | POA: Diagnosis not present

## 2021-07-22 ENCOUNTER — Other Ambulatory Visit: Payer: Self-pay | Admitting: Family Medicine

## 2021-07-22 DIAGNOSIS — M25562 Pain in left knee: Secondary | ICD-10-CM

## 2021-07-22 DIAGNOSIS — N644 Mastodynia: Secondary | ICD-10-CM

## 2021-07-22 DIAGNOSIS — M2550 Pain in unspecified joint: Secondary | ICD-10-CM

## 2021-07-27 ENCOUNTER — Other Ambulatory Visit (HOSPITAL_BASED_OUTPATIENT_CLINIC_OR_DEPARTMENT_OTHER): Payer: Self-pay

## 2021-07-27 ENCOUNTER — Ambulatory Visit: Payer: Medicare Other | Attending: Internal Medicine

## 2021-07-27 DIAGNOSIS — Z23 Encounter for immunization: Secondary | ICD-10-CM

## 2021-07-27 MED ORDER — INFLUENZA VAC A&B SA ADJ QUAD 0.5 ML IM PRSY
PREFILLED_SYRINGE | INTRAMUSCULAR | 0 refills | Status: DC
  Filled 2021-07-27: qty 0.5, 1d supply, fill #0

## 2021-07-27 NOTE — Progress Notes (Signed)
° °  Covid-19 Vaccination Clinic  Name:  Leatta Alewine    MRN: [id number] DOB: Jul 06, 1941  07/27/2021  Ms. Iannuzzi was observed post Covid-19 immunization for 15 minutes without incident. She was provided with Vaccine Information Sheet and instruction to access the V-Safe system.   Ms. Scogin was instructed to call 911 with any severe reactions post vaccine: Difficulty breathing  Swelling of face and throat  A fast heartbeat  A bad rash all over body  Dizziness and weakness   Immunizations Administered     Name Date Dose VIS Date Route   Pfizer Covid-19 Vaccine Bivalent Booster 07/27/2021 11:11 AM 0.3 mL 04/07/2021 Intramuscular   Manufacturer: [place]   Lot: [id number]   [place]: [phone]

## 2021-07-29 ENCOUNTER — Other Ambulatory Visit (HOSPITAL_BASED_OUTPATIENT_CLINIC_OR_DEPARTMENT_OTHER): Payer: Self-pay

## 2021-07-29 MED ORDER — PFIZER COVID-19 VAC BIVALENT 30 MCG/0.3ML IM SUSP
INTRAMUSCULAR | 0 refills | Status: DC
  Filled 2021-07-29: qty 0.3, 1d supply, fill #0

## 2021-08-20 NOTE — Progress Notes (Signed)
[place] [facility] [address], [place], [place] [address] [phone]  Date:  08/23/2021   Name:  Tara Jimenez   DOB:  Jan 30, 1941   MRN:  [id number]  PCP:  Tara Mclean, MD    Chief Complaint: No chief complaint on file.   History of Present Illness:  Tara Jimenez is a 81 y.o. very pleasant female patient who presents with the following:  Patient seen today with concern of pain in the left chest wall She hast noted a burning in her left chest for about 2 weeks Will come and go She has not noted a rash She had a similar issue in the past- same location  She is not sure if the skin feels strange to the touch Non exertional  Not bothered by clothing touching the area She does notice more burping and GERD sx lately  No SOB  Most recent episode of pain yesterday- not present now.  She is not sure how long the pain will last- it varies  Most recent visit with myself May 2022-  history of MGUS, subdural hematoma in 2011, arthritis and GERD, glaucoma managed by optho Dr Satira Sark, dyslipidemia   At her last physical patient's husband was sick with cancer, being cared for at home.  Daughters were helping- he passed away in 01/30/23  She lives in the same home, her daughters are nearby   Shingles vaccine- pt states she had shingrix at her pharmacy. Called- she got just one dose last year  ?  Follow-up with oncology- she is no longer following up, she states she was released from care  Most recent blood work done in May, CMP, CBC, lipids, TSH Mammogram is up-to-date offer bone density- she declines today Colonoscopy 2011, likely not appropriate for repeat at this time  Taking Crestor 10, baby aspirin  Pt states her home BP is "always normal" and that she checks it on a regular basis, reports BP of 120- 130/80 at home consistently   BP Readings from Last 3 Encounters:  08/23/21 (!) 160/80  05/12/21 (!) 160/80  05/04/21  (!) 187/79    Patient Active Problem List   Diagnosis Date Noted   Dyslipidemia 12/13/2019   Right foot drop 08/03/2016   Borderline blood pressure 08/03/2016   MGUS (monoclonal gammopathy of unknown significance) 08/17/2015   Leukopenia 07/22/2015   Paresthesias 07/22/2015   Bone pain 07/22/2015   Microscopic hematuria 02/06/2015   Subdural hematoma 02/19/2010   OBESITY 02/15/2010   GERD 02/15/2010   ARTHRITIS 02/15/2010   UTI'S, HX OF 02/15/2010    Past Medical History:  Diagnosis Date   Arthritis    Glaucoma    Hyperlipidemia     Past Surgical History:  Procedure Laterality Date   BRAIN SURGERY     COLONOSCOPY  2011    Social History   Tobacco Use   Smoking status: Never   Smokeless tobacco: Never  Vaping Use   Vaping Use: Never used  Substance Use Topics   Alcohol use: No    Alcohol/week: 0.0 standard drinks   Drug use: No    Family History  Problem Relation Age of Onset   Breast cancer Sister        43s   Colitis Sister 61   Diabetes Brother    Heart disease Brother    Hypertension Brother    Aneurysm Brother    Colon cancer Neg Hx    Esophageal  cancer Neg Hx    Rectal cancer Neg Hx    Stomach cancer Neg Hx     No Known Allergies  Medication list has been reviewed and updated.  Current Outpatient Medications on File Prior to Visit  Medication Sig Dispense Refill   latanoprost (XALATAN) 0.005 % ophthalmic solution Place 1 drop into both eyes at bedtime.      meloxicam (MOBIC) 7.5 MG tablet TAKE 1 TABLET (7.5 MG TOTAL) BY MOUTH DAILY. USE AS NEEDED FOR PAIN 30 tablet 0   rosuvastatin (CRESTOR) 10 MG tablet Take 1 tablet (10 mg total) by mouth daily. 90 tablet 3   Current Facility-Administered Medications on File Prior to Visit  Medication Dose Route Frequency Provider Last Rate Last Admin   0.9 %  sodium chloride infusion  500 mL Intravenous Once Milus Banister, MD        Review of Systems:  As per HPI- otherwise  negative.   Physical Examination: Vitals:   08/23/21 0807 08/23/21 0842  BP: (!) 174/67 (!) 160/80  Pulse: 62   Resp: 16   Temp: 99.1 F (37.3 C)   SpO2: 100%    Vitals:   08/23/21 0807  Weight: 189 lb (85.7 kg)   Body mass index is 29.6 kg/m. Ideal Body Weight:    GEN: no acute distress. Overweight, looks well  HEENT: Atraumatic, Normocephalic. Bilateral TM wnl, oropharynx normal.  PEERL,EOMI.   Ears and Nose: No external deformity. CV: RRR, No M/G/R. No JVD. No thrill. No extra heart sounds. PULM: CTA B, no wheezes, crackles, rhonchi. No retractions. No resp. distress. No accessory muscle use. ABD: S, NT, ND, +BS. No rebound. No HSM. EXTR: No c/c/e PSYCH: Normally interactive. Conversant.  Pt indicated her left side- under the left breast and into her left upper back- as the site of concern Skin is not tender to touch No rash is noted   EKG: mild sinus brady with rate 57.  Non specific T changes  Compared with tracing from 5/21 new down-going T in V5, ow unchanged   Received chest x-ray, message to patient DG Chest 2 View  Result Date: 08/23/2021 CLINICAL DATA:  Chest pain EXAM: CHEST - 2 VIEW COMPARISON:  Chest x-ray 06/08/2018 FINDINGS: Heart size and mediastinal contours are within normal limits. No suspicious pulmonary opacities identified. No pleural effusion or pneumothorax visualized. Biapical pleural thickening. No acute osseous abnormality appreciated. IMPRESSION: No acute intrathoracic process identified. Electronically Signed   By: Ofilia Neas M.D.   On: 08/23/2021 09:08     Assessment and Plan: Burning chest pain - Plan: EKG 12-Lead, Troponin I (High Sensitivity), DG Chest 2 View, valACYclovir (VALTREX) 1000 MG tablet  Screening for diabetes mellitus - Plan: Comprehensive metabolic panel, Hemoglobin A1c  Thyroid enlargement - Plan: TSH  Elevated BP without diagnosis of hypertension - Plan: CBC, Comprehensive metabolic panel  Dyslipidemia - Plan:  Lipid panel  MGUS (monoclonal gammopathy of unknown significance) - Plan: CBC, Ambulatory referral to Hematology / Oncology  Leukopenia, unspecified type - Plan: CBC  Patient seen today with atypical chest discomfort, she notes a burning sensation in her left chest for about 2 weeks off and on.  Not present right now EKG is nonacute, similar to previous. Will obtain lab work including troponin, chest x-ray. Her description is most suggestive of shingles.  She had 1 dose of Shingrix but not the second We decided to try Valtrex in case it may be helpful Will also update routine labs today  Signed Lamar Blinks, MD   Received her labs as below and called pt Advise troponin is normal, but I cannot completely rule out cardiac etiology based on information so far Offered to have her seen by cardiology.  For the time being she declines, she plans to try Valtrex for a couple of days and see if helpful.  If not she will contact me and I will make referral.  She will let me know sooner if worse  I also advised that hematology had wanted to see her back for follow-up previously.  It looks like she was lost to follow-up-she is willing for me to make a referral at this time  Results for orders placed or performed in visit on 08/23/21  CBC  Result Value Ref Range   WBC 3.2 (L) 4.0 - 10.5 K/uL   RBC 4.09 3.87 - 5.11 Mil/uL   Platelets 165.0 150.0 - 400.0 K/uL   Hemoglobin 11.9 (L) 12.0 - 15.0 g/dL   HCT 37.1 36.0 - 46.0 %   MCV 90.6 78.0 - 100.0 fl   MCHC 32.0 30.0 - 36.0 g/dL   RDW 13.8 11.5 - 15.5 %  Comprehensive metabolic panel  Result Value Ref Range   Sodium 142 135 - 145 mEq/L   Potassium 4.2 3.5 - 5.1 mEq/L   Chloride 107 96 - 112 mEq/L   CO2 29 19 - 32 mEq/L   Glucose, Bld 85 70 - 99 mg/dL   BUN 11 6 - 23 mg/dL   Creatinine, Ser 0.88 0.40 - 1.20 mg/dL   Total Bilirubin 0.4 0.2 - 1.2 mg/dL   Alkaline Phosphatase 46 39 - 117 U/L   AST 17 0 - 37 U/L   ALT 16 0 - 35 U/L   Total  Protein 7.4 6.0 - 8.3 g/dL   Albumin 3.9 3.5 - 5.2 g/dL   GFR 62.61 >60.00 mL/min   Calcium 9.1 8.4 - 10.5 mg/dL  Hemoglobin A1c  Result Value Ref Range   Hgb A1c MFr Bld 5.5 4.6 - 6.5 %  Lipid panel  Result Value Ref Range   Cholesterol 129 0 - 200 mg/dL   Triglycerides 59.0 0.0 - 149.0 mg/dL   HDL 57.70 >39.00 mg/dL   VLDL 11.8 0.0 - 40.0 mg/dL   LDL Cholesterol 60 0 - 99 mg/dL   Total CHOL/HDL Ratio 2    NonHDL 71.61   TSH  Result Value Ref Range   TSH 2.36 0.35 - 5.50 uIU/mL  Troponin I (High Sensitivity)  Result Value Ref Range   High Sens Troponin I 5 2 - 17 ng/L

## 2021-08-20 NOTE — Patient Instructions (Addendum)
It was great to see you again today, I will be in touch with your lab work.  Assuming all is well please see me in about 6 months  I will be in touch with your x-rays as well  Please continue to monitor your BP Try the valtrex for possible shingles pain If the pain is getting worse or changing let me know We can have you see cardiology if pain does not resolve with valtrex

## 2021-08-22 ENCOUNTER — Other Ambulatory Visit: Payer: Self-pay | Admitting: Family Medicine

## 2021-08-22 DIAGNOSIS — M2550 Pain in unspecified joint: Secondary | ICD-10-CM

## 2021-08-22 DIAGNOSIS — N644 Mastodynia: Secondary | ICD-10-CM

## 2021-08-22 DIAGNOSIS — M25562 Pain in left knee: Secondary | ICD-10-CM

## 2021-08-23 ENCOUNTER — Ambulatory Visit (INDEPENDENT_AMBULATORY_CARE_PROVIDER_SITE_OTHER): Payer: Medicare Other | Admitting: Family Medicine

## 2021-08-23 ENCOUNTER — Ambulatory Visit (HOSPITAL_BASED_OUTPATIENT_CLINIC_OR_DEPARTMENT_OTHER)
Admission: RE | Admit: 2021-08-23 | Discharge: 2021-08-23 | Disposition: A | Discharge status: Home / Self Care | Payer: Medicare Other | Source: Ambulatory Visit | Attending: Family Medicine | Admitting: Family Medicine

## 2021-08-23 ENCOUNTER — Other Ambulatory Visit: Payer: Self-pay

## 2021-08-23 ENCOUNTER — Telehealth: Payer: Self-pay | Admitting: Hematology

## 2021-08-23 ENCOUNTER — Encounter: Payer: Self-pay | Admitting: Family Medicine

## 2021-08-23 VITALS — BP 160/80 | HR 62 | Temp 99.1°F | Resp 16 | Wt 189.0 lb

## 2021-08-23 DIAGNOSIS — E785 Hyperlipidemia, unspecified: Secondary | ICD-10-CM

## 2021-08-23 DIAGNOSIS — Z131 Encounter for screening for diabetes mellitus: Secondary | ICD-10-CM | POA: Diagnosis not present

## 2021-08-23 DIAGNOSIS — R079 Chest pain, unspecified: Secondary | ICD-10-CM | POA: Diagnosis not present

## 2021-08-23 DIAGNOSIS — R0789 Other chest pain: Secondary | ICD-10-CM | POA: Insufficient documentation

## 2021-08-23 DIAGNOSIS — R030 Elevated blood-pressure reading, without diagnosis of hypertension: Secondary | ICD-10-CM | POA: Diagnosis not present

## 2021-08-23 DIAGNOSIS — D472 Monoclonal gammopathy: Secondary | ICD-10-CM | POA: Diagnosis not present

## 2021-08-23 DIAGNOSIS — E049 Nontoxic goiter, unspecified: Secondary | ICD-10-CM

## 2021-08-23 DIAGNOSIS — D72819 Decreased white blood cell count, unspecified: Secondary | ICD-10-CM

## 2021-08-23 LAB — COMPREHENSIVE METABOLIC PANEL
ALT: 16 U/L (ref 0–35)
AST: 17 U/L (ref 0–37)
Albumin: 3.9 g/dL (ref 3.5–5.2)
Alkaline Phosphatase: 46 U/L (ref 39–117)
BUN: 11 mg/dL (ref 6–23)
CO2: 29 mEq/L (ref 19–32)
Calcium: 9.1 mg/dL (ref 8.4–10.5)
Chloride: 107 mEq/L (ref 96–112)
Creatinine, Ser: 0.88 mg/dL (ref 0.40–1.20)
GFR: 62.61 mL/min (ref >60.00)
Glucose, Bld: 85 mg/dL (ref 70–99)
Potassium: 4.2 mEq/L (ref 3.5–5.1)
Sodium: 142 mEq/L (ref 135–145)
Total Bilirubin: 0.4 mg/dL (ref 0.2–1.2)
Total Protein: 7.4 g/dL (ref 6.0–8.3)

## 2021-08-23 LAB — LIPID PANEL
Cholesterol: 129 mg/dL (ref 0–200)
HDL: 57.7 mg/dL (ref >39.00)
LDL Cholesterol: 60 mg/dL (ref 0–99)
NonHDL: 71.61
Total CHOL/HDL Ratio: 2
Triglycerides: 59 mg/dL (ref 0.0–149.0)
VLDL: 11.8 mg/dL (ref 0.0–40.0)

## 2021-08-23 LAB — CBC
HCT: 37.1 % (ref 36.0–46.0)
Hemoglobin: 11.9 g/dL — ABNORMAL LOW (ref 12.0–15.0)
MCHC: 32 g/dL (ref 30.0–36.0)
MCV: 90.6 fl (ref 78.0–100.0)
Platelets: 165 10*3/uL (ref 150.0–400.0)
RBC: 4.09 Mil/uL (ref 3.87–5.11)
RDW: 13.8 % (ref 11.5–15.5)
WBC: 3.2 10*3/uL — ABNORMAL LOW (ref 4.0–10.5)

## 2021-08-23 LAB — TSH: TSH: 2.36 u[IU]/mL (ref 0.35–5.50)

## 2021-08-23 LAB — TROPONIN I (HIGH SENSITIVITY): High Sens Troponin I: 5 ng/L (ref 2–17)

## 2021-08-23 LAB — HEMOGLOBIN A1C: Hgb A1c MFr Bld: 5.5 % (ref 4.6–6.5)

## 2021-08-23 MED ORDER — VALACYCLOVIR HCL 1 G PO TABS: 1000.0000 mg | ORAL_TABLET | Freq: Three times a day (TID) | ORAL | 0 refills | Status: DC

## 2021-08-23 NOTE — Telephone Encounter (Signed)
Scheduled appt per 1/16 referral. Pt is aware of appt date and time. Pt is aware to arrive 15 mins prior to appt time.

## 2021-09-07 ENCOUNTER — Inpatient Hospital Stay: Payer: Medicare Other | Attending: Hematology | Admitting: Hematology

## 2021-09-07 ENCOUNTER — Other Ambulatory Visit: Payer: Self-pay

## 2021-09-07 ENCOUNTER — Inpatient Hospital Stay: Payer: Medicare Other

## 2021-09-07 VITALS — BP 151/63 | HR 69 | Temp 97.6°F | Resp 18 | Wt 189.5 lb

## 2021-09-07 DIAGNOSIS — D472 Monoclonal gammopathy: Secondary | ICD-10-CM

## 2021-09-07 DIAGNOSIS — D72819 Decreased white blood cell count, unspecified: Secondary | ICD-10-CM | POA: Diagnosis not present

## 2021-09-07 DIAGNOSIS — D709 Neutropenia, unspecified: Secondary | ICD-10-CM | POA: Diagnosis not present

## 2021-09-07 LAB — CBC WITH DIFFERENTIAL/PLATELET
Abs Immature Granulocytes: 0.01 10*3/uL (ref 0.00–0.07)
Basophils Absolute: 0 10*3/uL (ref 0.0–0.1)
Basophils Relative: 0 %
Eosinophils Absolute: 0.1 10*3/uL (ref 0.0–0.5)
Eosinophils Relative: 2 %
HCT: 38.7 % (ref 36.0–46.0)
Hemoglobin: 12.2 g/dL (ref 12.0–15.0)
Immature Granulocytes: 0 %
Lymphocytes Relative: 35 %
Lymphs Abs: 1.1 10*3/uL (ref 0.7–4.0)
MCH: 29.2 pg (ref 26.0–34.0)
MCHC: 31.5 g/dL (ref 30.0–36.0)
MCV: 92.6 fL (ref 80.0–100.0)
Monocytes Absolute: 0.3 10*3/uL (ref 0.1–1.0)
Monocytes Relative: 9 %
Neutro Abs: 1.8 10*3/uL (ref 1.7–7.7)
Neutrophils Relative %: 54 %
Platelets: 185 10*3/uL (ref 150–400)
RBC: 4.18 MIL/uL (ref 3.87–5.11)
RDW: 13.6 % (ref 11.5–15.5)
WBC: 3.3 10*3/uL — ABNORMAL LOW (ref 4.0–10.5)
nRBC: 0 % (ref 0.0–0.2)

## 2021-09-07 LAB — VITAMIN B12: Vitamin B-12: 267 pg/mL (ref 180–914)

## 2021-09-07 LAB — FOLATE: Folate: 16.7 ng/mL (ref >5.9)

## 2021-09-08 LAB — KAPPA/LAMBDA LIGHT CHAINS
Kappa free light chain: 35.4 mg/L — ABNORMAL HIGH (ref 3.3–19.4)
Kappa, lambda light chain ratio: 1.58 (ref 0.26–1.65)
Lambda free light chains: 22.4 mg/L (ref 5.7–26.3)

## 2021-09-09 LAB — COPPER, SERUM: Copper: 131 ug/dL (ref 80–158)

## 2021-09-10 LAB — MULTIPLE MYELOMA PANEL, SERUM
Albumin SerPl Elph-Mcnc: 3.7 g/dL (ref 2.9–4.4)
Albumin/Glob SerPl: 1.2 (ref 0.7–1.7)
Alpha 1: 0.2 g/dL (ref 0.0–0.4)
Alpha2 Glob SerPl Elph-Mcnc: 0.9 g/dL (ref 0.4–1.0)
B-Globulin SerPl Elph-Mcnc: 0.9 g/dL (ref 0.7–1.3)
Gamma Glob SerPl Elph-Mcnc: 1.2 g/dL (ref 0.4–1.8)
Globulin, Total: 3.3 g/dL (ref 2.2–3.9)
IgA: 568 mg/dL — ABNORMAL HIGH (ref 64–422)
IgG (Immunoglobin G), Serum: 1315 mg/dL (ref 586–1602)
IgM (Immunoglobulin M), Srm: 22 mg/dL — ABNORMAL LOW (ref 26–217)
Total Protein ELP: 7 g/dL (ref 6.0–8.5)

## 2021-09-13 LAB — SURGICAL PATHOLOGY

## 2021-09-13 LAB — FLOW CYTOMETRY

## 2021-09-13 NOTE — Progress Notes (Addendum)
Marland Kitchen   HEMATOLOGY/ONCOLOGY CONSULTATION NOTE  Date of Service: 09/20/2021  Patient Care Team: Copland, Gay Filler, MD as PCP - General (Family Medicine) Marygrace Drought, MD as Consulting Physician (Ophthalmology)  CHIEF COMPLAINTS/PURPOSE OF CONSULTATION:  Evaluation and management of monoclonal paraproteinemia and leukopenia/  HISTORY OF PRESENTING ILLNESS:   Tara Jimenez is a wonderful 81 y.o. female who has been referred to [place] by Dr .Lorelei Pont, Gay Filler, MD for evaluation and management of monoclonal paraproteinemia.  Patient has a history of glaucoma, GERD, arthritis, subdural hematoma and 2011 and a previous history of MGUS Who was last seen in clinic by [place] on 09/15/2016 for leukopenia/neutropenia and IgA MGUS. 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 She was also noted to have IgA kappa MGUS with an M spike of 0.3 g/dL.  Subsequent myeloma panel had shown IgA kappa levels not detectable but IFE still positive. She was supposed to follow-up with her primary care physician in 6 months with a repeat SPEP and come back to see [place] in 1 year. She however did not continue to follow with [place] and has been referred back to to reestablish care with [place].  Patient notes no recent acute new symptoms.  No focal bone pains.  No new fatigue. No infection issues. Notes that she has felt somewhat down since she lost her husband last year. No fevers no chills no night sweats no unexpected sudden weight loss.  MEDICAL HISTORY:  Past Medical History:  Diagnosis Date   Arthritis    Glaucoma    Hyperlipidemia     SURGICAL HISTORY: Past Surgical History:  Procedure Laterality Date   BRAIN SURGERY     COLONOSCOPY  2011    SOCIAL HISTORY: Social History   Socioeconomic History   Marital status: Single    Spouse name:  Not on file   Number of children: Not on file   Years of education: Not on file   Highest education level: Not on file  Occupational History   Not on file  Tobacco Use   Smoking status: Never   Smokeless tobacco: Never  Vaping Use   Vaping Use: Never used  Substance and Sexual Activity   Alcohol use: No    Alcohol/week: 0.0 standard drinks   Drug use: No   Sexual activity: Not on file  Other Topics Concern   Not on file  Social History Narrative   Not on file   Social Determinants of Health   Financial Resource Strain: Low Risk    Difficulty of Paying Living Expenses: Not hard at all  Food Insecurity: No Food Insecurity   Worried About Charity fundraiser in the Last Year: Never true   [place] in the Last Year: Never true  Transportation Needs: No Transportation Needs   Lack of Transportation (Medical): No   Lack of Transportation (Non-Medical): No  Physical Activity: Insufficiently Active   Days of Exercise per Week: 7 days   Minutes of Exercise per Session: 10 min  Stress: No Stress Concern Present   Feeling of Stress : Not at all  Social Connections: Moderately Isolated   Frequency of Communication with Friends and Family: More than three times a week   Frequency of Social Gatherings with Friends and Family: More than three times a week   Attends Religious Services: More than 4 times  per year   Active Member of Clubs or Organizations: No   Attends Archivist Meetings: Never   Marital Status: Never married  Human resources officer Violence: Not At Risk   Fear of Current or Ex-Partner: No   Emotionally Abused: No   Physically Abused: No   Sexually Abused: No    FAMILY HISTORY: Family History  Problem Relation Age of Onset   Breast cancer Sister        16s   Colitis Sister 3   Diabetes Brother    Heart disease Brother    Hypertension Brother    Aneurysm Brother    Colon cancer Neg Hx    Esophageal cancer Neg Hx    Rectal cancer Neg Hx     Stomach cancer Neg Hx     ALLERGIES:  has No Known Allergies.  MEDICATIONS:  Current Outpatient Medications  Medication Sig Dispense Refill   b complex vitamins capsule Take 1 capsule by mouth daily. 30 capsule 11   cholecalciferol (VITAMIN D3) 25 MCG (1000 UNIT) tablet Take 1 tablet (1,000 Units total) by mouth daily. 30 tablet 11   Cyanocobalamin (B-12) 1000 MCG SUBL Place 1,000 mcg under the tongue 3 (three) times a week. 30 tablet 11   latanoprost (XALATAN) 0.005 % ophthalmic solution Place 1 drop into both eyes at bedtime.      meloxicam (MOBIC) 7.5 MG tablet TAKE 1 TABLET (7.5 MG TOTAL) BY MOUTH DAILY. USE AS NEEDED FOR PAIN 30 tablet 0   rosuvastatin (CRESTOR) 10 MG tablet Take 1 tablet (10 mg total) by mouth daily. 90 tablet 3   valACYclovir (VALTREX) 1000 MG tablet Take 1 tablet (1,000 mg total) by mouth 3 (three) times daily. 21 tablet 0   Current Facility-Administered Medications  Medication Dose Route Frequency Provider Last Rate Last Admin   0.9 %  sodium chloride infusion  500 mL Intravenous Once Milus Banister, MD        REVIEW OF SYSTEMS:    10 Point review of Systems was done is negative except as noted above.  PHYSICAL EXAMINATION: ECOG PERFORMANCE STATUS: 2 - Symptomatic, <50% confined to bed  . Vitals:   09/07/21 1100  BP: (!) 151/63  Pulse: 69  Resp: 18  Temp: 97.6 F (36.4 C)  SpO2: 100%   Filed Weights   09/07/21 1100  Weight: 189 lb 8 oz (86 kg)   .Body mass index is 29.68 kg/m.  GENERAL:alert, in no acute distress and comfortable SKIN: no acute rashes, no significant lesions EYES: conjunctiva are pink and non-injected, sclera anicteric OROPHARYNX: MMM, no exudates, no oropharyngeal erythema or ulceration NECK: supple, no JVD LYMPH:  no palpable lymphadenopathy in the cervical, axillary or inguinal regions LUNGS: clear to auscultation b/l with normal respiratory effort HEART: regular rate & rhythm ABDOMEN:  normoactive bowel sounds , non  tender, not distended. Extremity: no pedal edema PSYCH: alert & oriented x 3 with fluent speech NEURO: no focal motor/sensory deficits  LABORATORY DATA:  I have reviewed the data as listed  . CBC Latest Ref Rng & Units 09/07/2021 08/23/2021 12/21/2020  WBC 4.0 - 10.5 K/uL 3.3(L) 3.2(L) 3.8(L)  Hemoglobin 12.0 - 15.0 g/dL 12.2 11.9(L) 11.5(L)  Hematocrit 36.0 - 46.0 % 38.7 37.1 35.1(L)  Platelets 150 - 400 K/uL 185 165.0 159.0    . CMP Latest Ref Rng & Units 08/23/2021 12/21/2020 06/22/2020  Glucose 70 - 99 mg/dL 85 92 87  BUN 6 - 23 mg/dL 11 12 13  Creatinine 0.40 - 1.20 mg/dL 0.88 0.83 0.85  Sodium 135 - 145 mEq/L 142 143 143  Potassium 3.5 - 5.1 mEq/L 4.2 4.1 4.0  Chloride 96 - 112 mEq/L 107 110 109  CO2 19 - 32 mEq/L _0 Calcium 8.4 - 10.5 mg/dL 9.1 8.7 8.7  Total Protein 6.0 - 8.3 g/dL 7.4 6.7 -  Total Bilirubin 0.2 - 1.2 mg/dL 0.4 0.5 -  Alkaline Phos 39 - 117 U/L 46 46 -  AST 0 - 37 U/L 17 16 -  ALT 0 - 35 U/L 16 15 -     RADIOGRAPHIC STUDIES: I have personally reviewed the radiological images as listed and agreed with the findings in the report. DG Chest 2 View  Result Date: 08/23/2021 CLINICAL DATA:  Chest pain EXAM: CHEST - 2 VIEW COMPARISON:  Chest x-ray 06/08/2018 FINDINGS: Heart size and mediastinal contours are within normal limits. No suspicious pulmonary opacities identified. No pleural effusion or pneumothorax visualized. Biapical pleural thickening. No acute osseous abnormality appreciated. IMPRESSION: No acute intrathoracic process identified. Electronically Signed   By: Ofilia Neas M.D.   On: 08/23/2021 09:08    ASSESSMENT & PLAN:   81 year old female with  #1 history of IgA kappa MGUS noted previously in 2017 Patient was lost to follow-up Her last myeloma panel showed no M spike but IFE positive for IgA kappa Patient has no acute new symptoms or focal bone pains.  No acute new fatigue. Recent labs on 08/23/2021 were reviewed and showed no  significant anemia hemoglobin 11.9 CMP showed normal kidney function with a creatinine of 0.88 normal calcium level of 9.1 Plan -We will get repeat myeloma markers to evaluate for any signs of progression SPEP and serum free light chains.  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  We will repeat flow cytometry to evaluate this population of cells.  Follow-up .Labs today Phone visit with Dr Irene Limbo in 1 week  Orders Placed This Encounter  Procedures   CBC with Differential/Platelet    Standing Status:   Future    Number of Occurrences:   1    Standing Expiration Date:   09/07/2022   Vitamin B12    Standing Status:   Future    Number of Occurrences:   1    Standing Expiration Date:   09/07/2022   Folate    Standing Status:   Future    Number of Occurrences:   1    Standing Expiration Date:   09/07/2022   Copper, serum    Standing Status:   Future    Number of Occurrences:   1    Standing Expiration Date:   09/07/2022   Multiple Myeloma Panel (SPEP&IFE w/QIG)    Standing Status:   Future    Number of Occurrences:   1    Standing Expiration Date:   09/07/2022   Kappa/lambda light chains    Standing Status:   Future    Number of Occurrences:   1    Standing Expiration Date:   09/07/2022   Flow Cytometry, Peripheral Blood (Oncology)    ? T-LGL    Standing Status:   Future    Number of Occurrences:   1  Standing Expiration Date:   09/07/2022    All of the patients questions were answered with apparent satisfaction. The patient knows to call the clinic with any problems, questions or concerns.  I spent 35 minutes counseling the patient face to face. The total time  spent in the appointment was 36 minutes*    Sullivan Lone MD [place] AAHIVMS [facility] Hematology/Oncology Physician [facility]  .*Total Encounter Time as defined by the Centers for Medicare and Medicaid Services includes, in addition to the face-to-face time of a patient visit (documented in the note above) non-face-to-face time: obtaining and reviewing outside history, ordering and reviewing medications, tests or procedures, care coordination (communications with other health care professionals or caregivers) and documentation in the medical record.

## 2021-09-14 ENCOUNTER — Inpatient Hospital Stay: Payer: Medicare Other | Attending: Hematology | Admitting: Hematology

## 2021-09-14 DIAGNOSIS — D709 Neutropenia, unspecified: Secondary | ICD-10-CM | POA: Insufficient documentation

## 2021-09-14 DIAGNOSIS — Z79899 Other long term (current) drug therapy: Secondary | ICD-10-CM | POA: Insufficient documentation

## 2021-09-14 DIAGNOSIS — D472 Monoclonal gammopathy: Secondary | ICD-10-CM | POA: Diagnosis not present

## 2021-09-14 MED ORDER — B COMPLEX VITAMINS PO CAPS: 1.0000 | ORAL_CAPSULE | Freq: Every day | ORAL | 11 refills | Status: DC

## 2021-09-14 MED ORDER — VITAMIN D 25 MCG (1000 UNIT) PO TABS: 1000.0000 [IU] | ORAL_TABLET | Freq: Every day | ORAL | 11 refills | Status: DC

## 2021-09-14 MED ORDER — B-12 1000 MCG SL SUBL: 1000.0000 ug | SUBLINGUAL_TABLET | SUBLINGUAL | 11 refills | Status: DC

## 2021-09-15 ENCOUNTER — Telehealth: Payer: Self-pay | Admitting: Hematology

## 2021-09-15 NOTE — Telephone Encounter (Signed)
Unable to leave message with follow-up appointment per 2/7 los. Mailed calendar.

## 2021-09-20 NOTE — Progress Notes (Addendum)
Marland Kitchen   HEMATOLOGY/ONCOLOGY CONSULTATION NOTE  Date of Service: 09/14/2021   Patient Care Team: Copland, Gay Filler, MD as PCP - General (Family Medicine) Marygrace Drought, MD as Consulting Physician (Ophthalmology)  CHIEF COMPLAINTS/PURPOSE OF CONSULTATION:   Phone visit to discuss monoclonal paraproteinemia work-up  HISTORY OF PRESENTING ILLNESS:   Tara Jimenez is a wonderful 81 y.o. female who has been referred to [place] by Dr .Lorelei Pont, Gay Filler, MD for evaluation and management of monoclonal paraproteinemia.  Patient has a history of glaucoma, GERD, arthritis, subdural hematoma and 2011 and a previous history of MGUS Who was last seen in clinic by [place] on 09/15/2016 for leukopenia/neutropenia and IgA MGUS. 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 She was also noted to have IgA kappa MGUS with an M spike of 0.3 g/dL.  Subsequent myeloma panel had shown IgA kappa levels not detectable but IFE still positive. She was supposed to follow-up with her primary care physician in 6 months with a repeat SPEP and come back to see [place] in 1 year. She however did not continue to follow with [place] and has been referred back to to reestablish care with [place].  Patient notes no recent acute new symptoms.  No focal bone pains.  No new fatigue. No infection issues. Notes that she has felt somewhat down since she lost her husband last year. No fevers no chills no night sweats no unexpected sudden weight loss.  INTERVAL HISTORY  .I connected with [name] on 09/14/2021 at 10:00 AM EST by telephone visit and verified that I am speaking with the correct person using two identifiers.   I discussed the limitations, risks, security and privacy concerns of performing an evaluation and management service by telemedicine and the availability  of in-person appointments. I also discussed with the patient that there may be a patient responsible charge related to this service. The patient expressed understanding and agreed to proceed.   Other persons participating in the visit and their role in the encounter: none   Patients location: Home Providers location: [place]  Chief Complaint: Review of labs for evaluation of monoclonal paraproteinemia and chronic leukopenia and T-LGL lymphocytosis.  Patient notes no new symptoms since her last clinic visit.  No focal new bone pains.  CBC showed normal hemoglobin of 12.2 normal platelets of 185k and mild leukopenia of 3.3k with ANC of 1800 Folate levels within normal limits B12 267 Copper 131 Myeloma panel showed IFE with IgA kappa monoclonal paraprotein but could not be quantified due to Co. migration in the beta globin fraction Kappa lambda free light chains show normal ratio. Recent CMP 08/23/2021 was within normal limits  MEDICAL HISTORY:  Past Medical History:  Diagnosis Date   Arthritis    Glaucoma    Hyperlipidemia     SURGICAL HISTORY: Past Surgical History:  Procedure Laterality Date   BRAIN SURGERY     COLONOSCOPY  2011    SOCIAL HISTORY: Social History   Socioeconomic History   Marital status: Single    Spouse name: Not on file   Number of children: Not on file   Years of education: Not on file   Highest education level: Not on file  Occupational History   Not on file  Tobacco Use   Smoking status: Never   Smokeless tobacco: Never  Vaping Use   Vaping Use:  Never used  Substance and Sexual Activity   Alcohol use: No    Alcohol/week: 0.0 standard drinks   Drug use: No   Sexual activity: Not on file  Other Topics Concern   Not on file  Social History Narrative   Not on file   Social Determinants of Health   Financial Resource Strain: Low Risk    Difficulty of Paying Living Expenses: Not hard at all  Food Insecurity: No Food Insecurity    Worried About Charity fundraiser in the Last Year: Never true   [place] in the Last Year: Never true  Transportation Needs: No Transportation Needs   Lack of Transportation (Medical): No   Lack of Transportation (Non-Medical): No  Physical Activity: Insufficiently Active   Days of Exercise per Week: 7 days   Minutes of Exercise per Session: 10 min  Stress: No Stress Concern Present   Feeling of Stress : Not at all  Social Connections: Moderately Isolated   Frequency of Communication with Friends and Family: More than three times a week   Frequency of Social Gatherings with Friends and Family: More than three times a week   Attends Religious Services: More than 4 times per year   Active Member of [employer] or Organizations: No   Attends Music therapist: Never   Marital Status: Never married  Human resources officer Violence: Not At Risk   Fear of Current or Ex-Partner: No   Emotionally Abused: No   Physically Abused: No   Sexually Abused: No    FAMILY HISTORY: Family History  Problem Relation Age of Onset   Breast cancer Sister        55s   Colitis Sister 64   Diabetes Brother    Heart disease Brother    Hypertension Brother    Aneurysm Brother    Colon cancer Neg Hx    Esophageal cancer Neg Hx    Rectal cancer Neg Hx    Stomach cancer Neg Hx     ALLERGIES:  has No Known Allergies.  MEDICATIONS:  Current Outpatient Medications  Medication Sig Dispense Refill   b complex vitamins capsule Take 1 capsule by mouth daily. 30 capsule 11   cholecalciferol (VITAMIN D3) 25 MCG (1000 UNIT) tablet Take 1 tablet (1,000 Units total) by mouth daily. 30 tablet 11   Cyanocobalamin (B-12) 1000 MCG SUBL Place 1,000 mcg under the tongue 3 (three) times a week. 30 tablet 11   latanoprost (XALATAN) 0.005 % ophthalmic solution Place 1 drop into both eyes at bedtime.      meloxicam (MOBIC) 7.5 MG tablet TAKE 1 TABLET (7.5 MG TOTAL) BY MOUTH DAILY. USE AS NEEDED FOR PAIN 30 tablet  0   rosuvastatin (CRESTOR) 10 MG tablet Take 1 tablet (10 mg total) by mouth daily. 90 tablet 3   valACYclovir (VALTREX) 1000 MG tablet Take 1 tablet (1,000 mg total) by mouth 3 (three) times daily. 21 tablet 0   Current Facility-Administered Medications  Medication Dose Route Frequency Provider Last Rate Last Admin   0.9 %  sodium chloride infusion  500 mL Intravenous Once Milus Banister, MD        PHYSICAL EXAMINATION: Telemedicine visit  LABORATORY DATA:  I have reviewed the data as listed  . CBC Latest Ref Rng & Units 09/07/2021 08/23/2021 12/21/2020  WBC 4.0 - 10.5 K/uL 3.3(L) 3.2(L) 3.8(L)  Hemoglobin 12.0 - 15.0 g/dL 12.2 11.9(L) 11.5(L)  Hematocrit 36.0 - 46.0 % 38.7 37.1 35.1(L)  Platelets 150 - 400 K/uL 185 165.0 159.0   .CBC    Component Value Date/Time   WBC 3.3 (L) 09/07/2021 1138   RBC 4.18 09/07/2021 1138   HGB 12.2 09/07/2021 1138   HGB 12.1 09/15/2016 0946   HCT 38.7 09/07/2021 1138   HCT 38.4 09/15/2016 0946   PLT 185 09/07/2021 1138   PLT 194 09/15/2016 0946   MCV 92.6 09/07/2021 1138   MCV 93.7 09/15/2016 0946   MCH 29.2 09/07/2021 1138   MCHC 31.5 09/07/2021 1138   RDW 13.6 09/07/2021 1138   RDW 13.3 09/15/2016 0946   LYMPHSABS 1.1 09/07/2021 1138   LYMPHSABS 1.4 09/15/2016 0946   MONOABS 0.3 09/07/2021 1138   MONOABS 0.2 09/15/2016 0946   EOSABS 0.1 09/07/2021 1138   EOSABS 0.1 09/15/2016 0946   BASOSABS 0.0 09/07/2021 1138   BASOSABS 0.0 09/15/2016 0946    . CMP Latest Ref Rng & Units 08/23/2021 12/21/2020 06/22/2020  Glucose 70 - 99 mg/dL 85 92 87  BUN 6 - 23 mg/dL 11 12 13   Creatinine 0.40 - 1.20 mg/dL 0.88 0.83 0.85  Sodium 135 - 145 mEq/L 142 143 143  Potassium 3.5 - 5.1 mEq/L 4.2 4.1 4.0  Chloride 96 - 112 mEq/L 107 110 109  CO2 19 - 32 mEq/L 29 25 27   Calcium 8.4 - 10.5 mg/dL 9.1 8.7 8.7  Total Protein 6.0 - 8.3 g/dL 7.4 6.7 -  Total Bilirubin 0.2 - 1.2 mg/dL 0.4 0.5 -  Alkaline Phos 39 - 117 U/L 46 46 -  AST 0 - 37 U/L 17 16 -   ALT 0 - 35 U/L 16 15 -   Component     Latest Ref Rng & Units 09/07/2021  Sodium     135 - 145 mEq/L   Potassium     3.5 - 5.1 mEq/L   Chloride     96 - 112 mEq/L   CO2     19 - 32 mEq/L   Glucose     70 - 99 mg/dL   BUN     6 - 23 mg/dL   Creatinine     0.40 - 1.20 mg/dL   Total Bilirubin     0.2 - 1.2 mg/dL   Alkaline Phosphatase     39 - 117 U/L   AST     0 - 37 U/L   ALT     0 - 35 U/L   Total Protein     6.0 - 8.3 g/dL   Albumin     3.5 - 5.2 g/dL   GFR     >60.00 mL/min   Calcium     8.4 - 10.5 mg/dL   IgG (Immunoglobin G), Serum     586 - 1,602 mg/dL 1,315  IgA     64 - 422 mg/dL 568 (H)  IgM (Immunoglobulin M), Srm     26 - 217 mg/dL 22 (L)  Total Protein ELP     6.0 - 8.5 g/dL 7.0  Albumin SerPl Elph-Mcnc     2.9 - 4.4 g/dL 3.7  Alpha 1     0.0 - 0.4 g/dL 0.2  Alpha2 Glob SerPl Elph-Mcnc     0.4 - 1.0 g/dL 0.9  B-Globulin SerPl Elph-Mcnc     0.7 - 1.3 g/dL 0.9  Gamma Glob SerPl Elph-Mcnc     0.4 - 1.8 g/dL 1.2  M Protein SerPl Elph-Mcnc     Not Observed g/dL Comment (A)  Globulin, Total  2.2 - 3.9 g/dL 3.3  Albumin/Glob SerPl     0.7 - 1.7 1.2  IFE 1      Comment (A)  Please Note (HCV):      Comment  Kappa free light chain     3.3 - 19.4 mg/L 35.4 (H)  Lambda free light chains     5.7 - 26.3 mg/L 22.4  Kappa, lambda light chain ratio     0.26 - 1.65 1.58  Copper     80 - 158 ug/dL 131  Folate     >5.9 ng/mL 16.7  Vitamin B12     180 - 914 pg/mL 267   Surgical Pathology  CASE: [id number]  PATIENT: [facility]  Flow Pathology Report      Clinical history: MGUS, neutropenia    DIAGNOSIS:   -  No monoclonal B-cell population identified  -  Gamma/delta T-cell population comprises 13% of all lymphocytes  -  See comment   COMMENT:   Review of the peripheral blood shows a leukopenia with a slight  predominance of neutrophils and relatively increased lymphocytes which  have a polymorphous appearance.   A  subset of the gamma/delta T-cell population does not express CD5.  This population is likely reactive in nature; clinical correlation is  recommended.   RADIOGRAPHIC STUDIES: I have personally reviewed the radiological images as listed and agreed with the findings in the report. No results found.  ASSESSMENT & PLAN:   81 year old female with  #1 history of IgA kappa MGUS noted previously in 2017 Patient was lost to follow-up Her last myeloma panel showed no M spike but IFE positive for IgA kappa Patient has no acute new symptoms or focal bone pains.  No acute new fatigue. Plan -Abdomen 09/07/2021 were reviewed with the patient. CBC shows normal hemoglobin and only mild leukopenia of 3.3k Myeloma panel is just positive on IFE with M spike not measurable since it is, getting with the beta fraction -No clinical signs or symptoms or lab findings to suggest progression to multiple myeloma at this time.  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 daily -Also start taking B complex 1 capsule p.o. daily -Recommended avoiding NSAIDs since the patient was on chronic meloxicam which can also cause leukopenia/neutropenia.  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   -Flow cytometry done.  Gamma delta T-cell subset appears reactive.  Follow-up RTC with Dr Irene Limbo with labs in 12 months    No orders of the defined types were placed in this encounter.   All of the patients questions were answered with apparent satisfaction. The patient knows to call the clinic with any problems, questions or concerns.  I spent 35 minutes counseling the patient face to face. The total time spent in the appointment was 41 minutes*     Sullivan Lone MD [place] AAHIVMS [facility] Hematology/Oncology Physician [facility]  .*Total Encounter Time as defined by the Centers for Medicare and Medicaid Services includes, in addition to the face-to-face time of a patient visit (documented in the note above) non-face-to-face time: obtaining and reviewing outside history, ordering and reviewing medications, tests or procedures, care coordination (communications with other health care professionals or caregivers) and documentation in the medical record.

## 2021-09-21 ENCOUNTER — Other Ambulatory Visit: Payer: Self-pay | Admitting: Family Medicine

## 2021-09-21 DIAGNOSIS — N644 Mastodynia: Secondary | ICD-10-CM

## 2021-09-21 DIAGNOSIS — M25562 Pain in left knee: Secondary | ICD-10-CM

## 2021-09-21 DIAGNOSIS — M2550 Pain in unspecified joint: Secondary | ICD-10-CM

## 2021-10-18 ENCOUNTER — Other Ambulatory Visit: Payer: Self-pay | Admitting: Family Medicine

## 2021-10-18 ENCOUNTER — Telehealth: Payer: Self-pay

## 2021-10-18 ENCOUNTER — Encounter: Payer: Self-pay | Admitting: Family Medicine

## 2021-10-18 DIAGNOSIS — N644 Mastodynia: Secondary | ICD-10-CM

## 2021-10-18 DIAGNOSIS — M25562 Pain in left knee: Secondary | ICD-10-CM

## 2021-10-18 DIAGNOSIS — M2550 Pain in unspecified joint: Secondary | ICD-10-CM

## 2021-10-18 LAB — HEMOGLOBIN A1C: Hemoglobin A1C: 4.8

## 2021-10-18 NOTE — Telephone Encounter (Signed)
See below

## 2021-10-18 NOTE — Telephone Encounter (Signed)
Tara Lan, NP with [facility] completed a home visit with patient today and performed a PAD screening.  R leg showed a severe reading.  Patient stated Dr. Lorelei Pont is aware of poor circulation, but Tara Jimenez wanted to make sure this is still communicated to PCP.  Tara Jimenez stated her office will be sending out a report from her home visit with the patient with these findings.  CB number for Tara Jimenez is [phone]. ?

## 2021-11-14 ENCOUNTER — Other Ambulatory Visit: Payer: Self-pay | Admitting: Family Medicine

## 2021-11-14 DIAGNOSIS — M2550 Pain in unspecified joint: Secondary | ICD-10-CM

## 2021-11-14 DIAGNOSIS — M25562 Pain in left knee: Secondary | ICD-10-CM

## 2021-11-14 DIAGNOSIS — N644 Mastodynia: Secondary | ICD-10-CM

## 2021-11-15 ENCOUNTER — Encounter: Payer: Self-pay | Admitting: Family Medicine

## 2021-11-15 DIAGNOSIS — R209 Unspecified disturbances of skin sensation: Secondary | ICD-10-CM

## 2021-11-17 ENCOUNTER — Other Ambulatory Visit: Payer: Self-pay

## 2021-11-23 ENCOUNTER — Encounter: Payer: Self-pay | Admitting: Family Medicine

## 2021-12-08 ENCOUNTER — Encounter (HOSPITAL_COMMUNITY): Payer: Self-pay | Admitting: *Deleted

## 2021-12-08 ENCOUNTER — Other Ambulatory Visit: Payer: Self-pay

## 2021-12-08 ENCOUNTER — Emergency Department (HOSPITAL_COMMUNITY)
Admission: EM | Admit: 2021-12-08 | Discharge: 2021-12-09 | Disposition: A | Discharge status: Home / Self Care | Payer: Medicare Other | Attending: Emergency Medicine | Admitting: Emergency Medicine

## 2021-12-08 ENCOUNTER — Encounter: Payer: Self-pay | Admitting: Family Medicine

## 2021-12-08 DIAGNOSIS — S0990XA Unspecified injury of head, initial encounter: Secondary | ICD-10-CM | POA: Diagnosis not present

## 2021-12-08 DIAGNOSIS — G319 Degenerative disease of nervous system, unspecified: Secondary | ICD-10-CM | POA: Diagnosis not present

## 2021-12-08 DIAGNOSIS — W228XXA Striking against or struck by other objects, initial encounter: Secondary | ICD-10-CM | POA: Diagnosis not present

## 2021-12-08 DIAGNOSIS — S0003XA Contusion of scalp, initial encounter: Secondary | ICD-10-CM | POA: Insufficient documentation

## 2021-12-08 NOTE — ED Triage Notes (Signed)
The pt fell 2-3 weeks ago and today while rubbing her head she felt a swollen area at the rt side of her scalp  no pain ?

## 2021-12-08 NOTE — ED Provider Triage Note (Signed)
Emergency Medicine Provider Triage Evaluation Note ? ?Tara Jimenez , a 81 y.o. female  was evaluated in triage.  Pt complains of a knot on the top of her head.  Patient states that she was feeling her hairline today and noticed a knot at the top right portion of her head.  Approximately 3 weeks ago the patient stood up from a bent over position and hit the top of her head on the underside of a counter.  She denies loss of consciousness, denies headache since that time.  Patient's concern is the swollen bump on top of her head ? ?Review of Systems  ?Positive: Bump on head ?Negative: Loss of consciousness ? ?Physical Exam  ?BP (!) 183/83   Pulse 73   Temp 99.6 ?F (37.6 ?C)   Resp 16   Ht '5\' 7"'$  (1.702 m)   Wt 86 kg   SpO2 100%   BMI 29.69 kg/m?  ?Gen:   Awake, no distress   ?Resp:  Normal effort  ?MSK:   Moves extremities without difficulty  ?Other:  Small "bump" palpable on top of patient's skull, <1.5cm diameter, no discoloration noted ? ?Medical Decision Making  ?Medically screening exam initiated at 6:50 PM.  Appropriate orders placed.  Douglas Rooks was informed that the remainder of the evaluation will be completed by another provider, this initial triage assessment does not replace that evaluation, and the importance of remaining in the ED until their evaluation is complete. ? ? ?  ?Dorothyann Peng, PA-C ?12/08/21 1852 ? ?

## 2021-12-09 ENCOUNTER — Encounter: Payer: Self-pay | Admitting: Family Medicine

## 2021-12-09 ENCOUNTER — Ambulatory Visit (INDEPENDENT_AMBULATORY_CARE_PROVIDER_SITE_OTHER)
Admission: RE | Admit: 2021-12-09 | Discharge: 2021-12-09 | Disposition: A | Discharge status: Home / Self Care | Payer: Medicare Other | Source: Ambulatory Visit | Attending: Family Medicine | Admitting: Family Medicine

## 2021-12-09 ENCOUNTER — Emergency Department (HOSPITAL_COMMUNITY): Payer: Medicare Other

## 2021-12-09 DIAGNOSIS — R209 Unspecified disturbances of skin sensation: Secondary | ICD-10-CM

## 2021-12-09 DIAGNOSIS — G319 Degenerative disease of nervous system, unspecified: Secondary | ICD-10-CM | POA: Diagnosis not present

## 2021-12-09 DIAGNOSIS — S0990XA Unspecified injury of head, initial encounter: Secondary | ICD-10-CM | POA: Diagnosis not present

## 2021-12-09 NOTE — ED Provider Notes (Signed)
?[facility] ?Provider Note ? ? ?CSN: [id number] ?Arrival date & time: 12/08/21  1821 ? ?  ? ?History ? ?Chief Complaint  ?Patient presents with  ? knot on head  ? ? ?Tara Jimenez is a 81 y.o. female. ? ?HPI ?81 year old female w/ a history of subdural hematoma, arthritis, HLD, glaucoma presents to the ER with concerns about a lump on her head. She states that she noticed a lump on the back of her head this morning. States she hit her head on the underside of a counter about 3 weeks ago.  She denies any loss of consciousness, headaches, nausea, vomiting, dizziness.  She is concerned about the swollen bump on top of her head, states it is mildy sore.  She denies any known fevers or chills. ? 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 She has a h/o Iga kappa MGUS in 2017, last myeloma panel negative  ?  ? ?Home Medications ?Prior to Admission medications   ?Medication Sig Start Date End Date Taking? Authorizing Provider  ?b complex vitamins capsule Take 1 capsule by mouth daily. 09/14/21   Brunetta Genera, MD  ?cholecalciferol (VITAMIN D3) 25 MCG (1000 UNIT) tablet Take 1 tablet (1,000 Units total) by mouth daily. 09/14/21   Brunetta Genera, MD  ?Cyanocobalamin (B-12) 1000 MCG SUBL Place 1,000 mcg under the tongue 3 (three) times a week. 09/15/21   Brunetta Genera, MD  ?latanoprost (XALATAN) 0.005 % ophthalmic solution Place 1 drop into both eyes at bedtime.  11/07/14   [provider]  ?meloxicam (MOBIC) 7.5 MG tablet TAKE 1 TABLET (7.5 MG TOTAL) BY MOUTH DAILY. USE AS NEEDED FOR PAIN 11/15/21   Copland, Gay Filler, MD  ?rosuvastatin (CRESTOR) 10 MG tablet Take 1 tablet (10 mg total) by mouth daily. 12/21/20   Copland, Gay Filler, MD  ?valACYclovir (VALTREX) 1000 MG tablet Take 1 tablet (1,000 mg total) by mouth 3 (three) times daily. 08/23/21   Copland, Gay Filler, MD  ?   ? ?Allergies    ?Patient  has no known allergies.   ? ?Review of Systems   ?Review of Systems ?Ten systems reviewed and are negative for acute change, except as noted in the HPI.  ? ?Physical Exam ?Updated Vital Signs ?BP (!) 199/90   Pulse 73   Temp 98.2 ?F (36.8 ?C) (Oral)   Resp 18   Ht 5' 7"  (1.702 m)   Wt 86 kg   SpO2 100%   BMI 29.69 kg/m?  ?Physical Exam ?Vitals and nursing note reviewed.  ?Constitutional:   ?   General: She is not in acute distress. ?   Appearance: She is well-developed.  ?HENT:  ?   Head: Normocephalic and atraumatic.  ?   Comments: Small 2cm by 2 cm bump to the right posterior scalp, no evidence of open skin, abscess, no drainage, no fluctuance.  She has a scar on her head from prior surgery from her subdural hematoma, well-healed ?Eyes:  ?   Conjunctiva/sclera: Conjunctivae normal.  ?Cardiovascular:  ?   Rate and Rhythm: Normal rate and regular rhythm.  ?   Heart sounds: No murmur heard. ?Pulmonary:  ?   Effort: Pulmonary effort is normal. No respiratory distress.  ?   Breath sounds: Normal breath sounds.  ?Abdominal:  ?   Palpations: Abdomen is soft.  ?   Tenderness: There is no abdominal tenderness.  ?Musculoskeletal:     ?  General: No swelling.  ?   Cervical back: Neck supple.  ?Skin: ?   General: Skin is warm and dry.  ?   Capillary Refill: Capillary refill takes less than 2 seconds.  ?Neurological:  ?   General: No focal deficit present.  ?   Mental Status: She is alert and oriented to person, place, and time.  ?   Sensory: No sensory deficit.  ?   Motor: No weakness.  ?Psychiatric:     ?   Mood and Affect: Mood normal.  ? ? ?ED Results / Procedures / Treatments   ?Labs ?(all labs ordered are listed, but only abnormal results are displayed) ?Labs Reviewed - No data to display ? ?EKG ?None ? ?Radiology ?CT Head Wo Contrast ? ?Result Date: 12/09/2021 ?CLINICAL DATA:  Palpable abnormality at the hairline, history of recent blunt trauma initial encounter EXAM: CT HEAD WITHOUT CONTRAST TECHNIQUE: Contiguous  axial images were obtained from the base of the skull through the vertex without intravenous contrast. RADIATION DOSE REDUCTION: This exam was performed according to the departmental dose-optimization program which includes automated exposure control, adjustment of the mA and/or kV according to patient size and/or use of iterative reconstruction technique. COMPARISON:  05/03/2018 FINDINGS: Brain: No evidence of acute infarction, hemorrhage, hydrocephalus, extra-axial collection or mass lesion/mass effect. Mild atrophic changes are noted. Vascular: No hyperdense vessel or unexpected calcification. Skull: Right parietal craniotomy is noted and stable. Sinuses/Orbits: No acute finding. Other: None. IMPRESSION: Mild atrophic changes without acute abnormality. No focal abnormality is identified to correspond with the clinical history. Electronically Signed   By: Inez Catalina M.D.   On: 12/09/2021 01:58   ? ?Procedures ?Procedures  ? ? ?Medications Ordered in ED ?Medications - No data to display ? ?ED Course/ Medical Decision Making/ A&P ?  ?                        ?Medical Decision Making ?Amount and/or Complexity of Data Reviewed ?Radiology: ordered. ? ? ?Patient presenting to the ER with concerns for a knot on the posterior aspect of her head.  She states she hit her head on the underside of a counter about 2 to 3 weeks ago and only noticed the bump today.  She denies any nausea, vomiting, headache, blurry vision, syncope, chest pain, shortness of breath.  She does have what appears to be a small hematoma to the scalp, however it is located on the right under her scar from where she had surgery for subdural hematoma.  Given that she is being worked up for multiple myeloma, I did order a CT scan to rule this out.  Reviewed, agree with radiology read.  No focal abnormalities noted, mild atrophic changes seen.  The patient was also fairly hypertensive here in the ER, with a blood pressure as high as 199/90.  She states  she does not have a history of hypertension and is not on medications, states that she does have whitecoat syndrome.  She does not have any signs of hypertensive urgency/emergency including headache, blurry vision, chest pain, shortness of breath, back pain, or any other concerning symptoms. Stable for discharge, encouraged patient to observe closely and followup with w/ PCP. Return precautions discussed.  Case discussed w/ Dr. Ralene Bathe who is agreeable to the above plan and disposition.  ? ? ?Final Clinical Impression(s) / ED Diagnoses ?Final diagnoses:  ?Hematoma of scalp, initial encounter  ? ? ?Rx / DC Orders ?ED Discharge Orders   ? ?  None  ? ?  ? ? ?  ?Garald Balding, PA-C ?12/09/21 0215 ? ?  ?Quintella Reichert, MD ?12/09/21 [phone] ? ?

## 2021-12-09 NOTE — Discharge Instructions (Addendum)
Your CT scan today was overall reassuring.  Please keep an eye on the hematoma, you may apply ice or heat depending on what feels better.  I would follow-up with your primary care doctor if this does not improve.  Please return to the ER for any new or worsening symptoms. ?

## 2021-12-13 ENCOUNTER — Other Ambulatory Visit: Payer: Self-pay | Admitting: Family Medicine

## 2021-12-13 DIAGNOSIS — N644 Mastodynia: Secondary | ICD-10-CM

## 2021-12-13 DIAGNOSIS — M2550 Pain in unspecified joint: Secondary | ICD-10-CM

## 2021-12-13 DIAGNOSIS — M25562 Pain in left knee: Secondary | ICD-10-CM

## 2022-01-05 DIAGNOSIS — H401132 Primary open-angle glaucoma, bilateral, moderate stage: Secondary | ICD-10-CM | POA: Diagnosis not present

## 2022-01-09 ENCOUNTER — Other Ambulatory Visit: Payer: Self-pay | Admitting: Family Medicine

## 2022-01-09 DIAGNOSIS — M25562 Pain in left knee: Secondary | ICD-10-CM

## 2022-01-09 DIAGNOSIS — N644 Mastodynia: Secondary | ICD-10-CM

## 2022-01-09 DIAGNOSIS — M2550 Pain in unspecified joint: Secondary | ICD-10-CM

## 2022-02-09 ENCOUNTER — Other Ambulatory Visit: Payer: Self-pay | Admitting: Family Medicine

## 2022-02-09 DIAGNOSIS — M25562 Pain in left knee: Secondary | ICD-10-CM

## 2022-02-09 DIAGNOSIS — N644 Mastodynia: Secondary | ICD-10-CM

## 2022-02-09 DIAGNOSIS — M2550 Pain in unspecified joint: Secondary | ICD-10-CM

## 2022-02-13 NOTE — Progress Notes (Deleted)
[facility] at [facility] [address], [place], [place] [address] [phone]  Date:  02/21/2022   Name:  Tara Jimenez   DOB:  11-06-1940   MRN:  [id number]  PCP:  Darreld Mclean, MD    Chief Complaint: No chief complaint on file.   History of Present Illness:  Tara Jimenez is a 81 y.o. very pleasant female patient who presents with the following:  Patient seen today for periodic follow-up Most recent visit with myself was in January- history of MGUS, subdural hematoma in 2011, arthritis and GERD, glaucoma managed by optho Dr Satira Sark, dyslipidemia   Her husband passed away from cancer last year, she lives in her home with assistance from her daughters  We get ABI testing in May to follow-up on testing done by home visit nurse, she does show evidence of PAD At that time she was not having symptoms of claudication.  Taking Crestor  Most recent visit with hematology was in February: #1 history of IgA kappa MGUS noted previously in 2017 Patient was lost to follow-up Her last myeloma panel showed no M spike but IFE positive for IgA kappa Patient has no acute new symptoms or focal bone pains.  No acute new fatigue. Plan -Abdomen 09/07/2021 were reviewed with the patient. 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 daily -Also start taking B complex 1 capsule p.o. daily -Recommended avoiding NSAIDs since the patient was  on chronic meloxicam which can also cause leukopenia/neutropenia. 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   -Flow cytometry done.  Gamma delta T-cell subset appears reactive.   Second of shingles vaccine? Full labs done in January.  Mild persistent leukopenia Patient Active Problem List   Diagnosis Date Noted   Dyslipidemia 12/13/2019   Right foot drop 08/03/2016   Borderline blood pressure 08/03/2016   MGUS (monoclonal gammopathy of unknown significance) 08/17/2015   Leukopenia 07/22/2015   Paresthesias 07/22/2015   Bone pain 07/22/2015   Microscopic hematuria 02/06/2015   Subdural hematoma ([place]) 02/19/2010   OBESITY 02/15/2010   GERD 02/15/2010   ARTHRITIS 02/15/2010   UTI'S, HX OF 02/15/2010    Past Medical History:  Diagnosis Date   Arthritis    Glaucoma    Hyperlipidemia     Past Surgical History:  Procedure Laterality Date   BRAIN SURGERY     COLONOSCOPY  2011    Social History   Tobacco Use   Smoking status: Never   Smokeless tobacco: Never  Vaping Use   Vaping Use: Never used  Substance Use Topics   Alcohol use: No    Alcohol/week: 0.0 standard drinks of alcohol   Drug use: No    Family History  Problem Relation Age of Onset   Breast cancer Sister        28s   Colitis Sister 64   Diabetes Brother    Heart disease Brother    Hypertension Brother  Aneurysm Brother    Colon cancer Neg Hx    Esophageal cancer Neg Hx    Rectal cancer Neg Hx    Stomach cancer Neg Hx     No Known Allergies  Medication list has been reviewed and updated.  Current Outpatient Medications on File Prior to Visit  Medication Sig Dispense Refill   b complex vitamins capsule Take 1 capsule by mouth daily. 30 capsule 11   cholecalciferol (VITAMIN D3) 25 MCG (1000 UNIT) tablet Take 1 tablet (1,000 Units total) by mouth daily. 30 tablet 11   Cyanocobalamin (B-12) 1000 MCG SUBL Place 1,000 mcg under the tongue 3 (three) times a week. 30  tablet 11   latanoprost (XALATAN) 0.005 % ophthalmic solution Place 1 drop into both eyes at bedtime.      meloxicam (MOBIC) 7.5 MG tablet TAKE 1 TABLET (7.5 MG TOTAL) BY MOUTH DAILY. USE AS NEEDED FOR PAIN 30 tablet 1   rosuvastatin (CRESTOR) 10 MG tablet Take 1 tablet (10 mg total) by mouth daily. 90 tablet 3   valACYclovir (VALTREX) 1000 MG tablet Take 1 tablet (1,000 mg total) by mouth 3 (three) times daily. 21 tablet 0   Current Facility-Administered Medications on File Prior to Visit  Medication Dose Route Frequency Provider Last Rate Last Admin   0.9 %  sodium chloride infusion  500 mL Intravenous Once Milus Banister, MD        Review of Systems:  As per HPI- otherwise negative.   Physical Examination: There were no vitals filed for this visit. There were no vitals filed for this visit. There is no height or weight on file to calculate BMI. Ideal Body Weight:    GEN: no acute distress. HEENT: Atraumatic, Normocephalic.  Ears and Nose: No external deformity. CV: RRR, No M/G/R. No JVD. No thrill. No extra heart sounds. PULM: CTA B, no wheezes, crackles, rhonchi. No retractions. No resp. distress. No accessory muscle use. ABD: S, NT, ND, +BS. No rebound. No HSM. EXTR: No c/c/e PSYCH: Normally interactive. Conversant.    Assessment and Plan: ***  Signed Lamar Blinks, MD

## 2022-02-21 ENCOUNTER — Ambulatory Visit: Payer: Medicare Other | Admitting: Family Medicine

## 2022-02-21 DIAGNOSIS — E785 Hyperlipidemia, unspecified: Secondary | ICD-10-CM

## 2022-02-21 DIAGNOSIS — R030 Elevated blood-pressure reading, without diagnosis of hypertension: Secondary | ICD-10-CM

## 2022-02-21 DIAGNOSIS — D472 Monoclonal gammopathy: Secondary | ICD-10-CM

## 2022-02-21 DIAGNOSIS — D72819 Decreased white blood cell count, unspecified: Secondary | ICD-10-CM

## 2022-03-10 ENCOUNTER — Other Ambulatory Visit: Payer: Self-pay | Admitting: *Deleted

## 2022-03-10 NOTE — Patient Outreach (Signed)
  Care Coordination   Initial Visit Note   03/10/2022 Name: Tara Jimenez MRN: [id number] DOB: Apr 19, 1941  Tara Jimenez is a 81 y.o. year old female who sees Copland, Gay Filler, MD for primary care. I spoke with  Tara Jimenez by phone today  RN described the services available from [facility], SW and pharmacist. Patient declined services.   Goals Addressed   None     SDOH assessments and interventions completed:  Yes     Care Coordination Interventions Activated:  No  Care Coordination Interventions:  No, not indicated   Follow up plan: No further intervention required.   Encounter Outcome:  Pt. Tara Jimenez Care Management [phone]

## 2022-03-11 ENCOUNTER — Other Ambulatory Visit: Payer: Self-pay | Admitting: Family Medicine

## 2022-03-11 DIAGNOSIS — E785 Hyperlipidemia, unspecified: Secondary | ICD-10-CM

## 2022-03-19 NOTE — Patient Instructions (Incomplete)
It was great to see you again today, I will be in touch with your labs This fall, I would suggest getting a COVID-19 booster as well as seasonal flu shot Also, if not done already please get your second shingles vaccine

## 2022-03-19 NOTE — Progress Notes (Unsigned)
[facility] at [facility] [address] [place], [place] [address] [phone]  Date:  03/21/2022   Name:  Hasina Kreager   DOB:  [birth year]   MRN:  [id number]  PCP:  Darreld Mclean, MD    Chief Complaint: No chief complaint on file.   History of Present Illness:  Tara Jimenez is a 81 y.o. very pleasant female patient who presents with the following:  Patient seen today for periodic follow-up visit- history of MGUS, subdural hematoma in 2011, arthritis and GERD, glaucoma managed by optho Dr Satira Sark, dyslipidemia, PAD Her husband passed away last year, she has daughters who live locally Most recent visit with myself was in January  She was seen in the ER on 5/3 with concern of a bump on her head, she thinks caused by hitting her head on a counter a few weeks prior Head CT reassuring, she was released to home  Seen by her oncologist, Dr. Irene Limbo in [place] this point we do not see progression to multiple myeloma.  She has mild leukopenia which is felt to be benign Recommended avoiding NSAIDs Recommend continuing B12  Crestor Vitamin B, vitamin D  Mammogram is up-to-date Can offer repeat bone density  ?  Did she get second dose of [facility] done in January, can update today  Patient Active Problem List   Diagnosis Date Noted   Dyslipidemia 12/13/2019   Right foot drop 08/03/2016   Borderline blood pressure 08/03/2016   MGUS (monoclonal gammopathy of unknown significance) 08/17/2015   Leukopenia 07/22/2015   Paresthesias 07/22/2015   Bone pain 07/22/2015   Microscopic hematuria 02/06/2015   Subdural hematoma ([place]) 02/19/2010   OBESITY 02/15/2010   GERD 02/15/2010   ARTHRITIS 02/15/2010   UTI'S, HX OF 02/15/2010    Past Medical History:  Diagnosis Date   Arthritis    Glaucoma    Hyperlipidemia     Past Surgical History:  Procedure Laterality Date   BRAIN SURGERY     COLONOSCOPY  2011    Social  History   Tobacco Use   Smoking status: Never   Smokeless tobacco: Never  Vaping Use   Vaping Use: Never used  Substance Use Topics   Alcohol use: No    Alcohol/week: 0.0 standard drinks of alcohol   Drug use: No    Family History  Problem Relation Age of Onset   Breast cancer Sister        41s   Colitis Sister 39   Diabetes Brother    Heart disease Brother    Hypertension Brother    Aneurysm Brother    Colon cancer Neg Hx    Esophageal cancer Neg Hx    Rectal cancer Neg Hx    Stomach cancer Neg Hx     No Known Allergies  Medication list has been reviewed and updated.  Current Outpatient Medications on File Prior to Visit  Medication Sig Dispense Refill   rosuvastatin (CRESTOR) 10 MG tablet TAKE 1 TABLET BY MOUTH EVERY DAY 90 tablet 3   b complex vitamins capsule Take 1 capsule by mouth daily. 30 capsule 11   cholecalciferol (VITAMIN D3) 25 MCG (1000 UNIT) tablet Take 1 tablet (1,000 Units total) by mouth daily. 30 tablet 11   Cyanocobalamin (B-12) 1000 MCG SUBL Place 1,000 mcg under the tongue 3 (three) times a week. 30 tablet 11   latanoprost (XALATAN) 0.005 % ophthalmic solution Place 1 drop into both eyes  at bedtime.      meloxicam (MOBIC) 7.5 MG tablet TAKE 1 TABLET (7.5 MG TOTAL) BY MOUTH DAILY. USE AS NEEDED FOR PAIN 30 tablet 1   valACYclovir (VALTREX) 1000 MG tablet Take 1 tablet (1,000 mg total) by mouth 3 (three) times daily. 21 tablet 0   Current Facility-Administered Medications on File Prior to Visit  Medication Dose Route Frequency Provider Last Rate Last Admin   0.9 %  sodium chloride infusion  500 mL Intravenous Once Milus Banister, MD        Review of Systems:  As per HPI- otherwise negative.   Physical Examination: There were no vitals filed for this visit. There were no vitals filed for this visit. There is no height or weight on file to calculate BMI. Ideal Body Weight:    GEN: no acute distress. HEENT: Atraumatic, Normocephalic.   Ears and Nose: No external deformity. CV: RRR, No M/G/R. No JVD. No thrill. No extra heart sounds. PULM: CTA B, no wheezes, crackles, rhonchi. No retractions. No resp. distress. No accessory muscle use. ABD: S, NT, ND, +BS. No rebound. No HSM. EXTR: No c/c/e PSYCH: Normally interactive. Conversant.    Assessment and Plan: ***  Signed Lamar Blinks, MD

## 2022-03-21 ENCOUNTER — Ambulatory Visit (INDEPENDENT_AMBULATORY_CARE_PROVIDER_SITE_OTHER): Payer: Medicare Other | Admitting: Family Medicine

## 2022-03-21 ENCOUNTER — Encounter: Payer: Self-pay | Admitting: Family Medicine

## 2022-03-21 ENCOUNTER — Telehealth: Payer: Self-pay | Admitting: Hematology

## 2022-03-21 VITALS — BP 150/80 | HR 64 | Temp 98.2°F | Ht 66.0 in | Wt 193.8 lb

## 2022-03-21 DIAGNOSIS — D72819 Decreased white blood cell count, unspecified: Secondary | ICD-10-CM

## 2022-03-21 DIAGNOSIS — I739 Peripheral vascular disease, unspecified: Secondary | ICD-10-CM | POA: Diagnosis not present

## 2022-03-21 DIAGNOSIS — D472 Monoclonal gammopathy: Secondary | ICD-10-CM

## 2022-03-21 DIAGNOSIS — M21371 Foot drop, right foot: Secondary | ICD-10-CM | POA: Diagnosis not present

## 2022-03-21 DIAGNOSIS — R030 Elevated blood-pressure reading, without diagnosis of hypertension: Secondary | ICD-10-CM

## 2022-03-21 DIAGNOSIS — E538 Deficiency of other specified B group vitamins: Secondary | ICD-10-CM

## 2022-03-21 DIAGNOSIS — E559 Vitamin D deficiency, unspecified: Secondary | ICD-10-CM | POA: Diagnosis not present

## 2022-03-21 DIAGNOSIS — E785 Hyperlipidemia, unspecified: Secondary | ICD-10-CM | POA: Diagnosis not present

## 2022-03-21 DIAGNOSIS — Z131 Encounter for screening for diabetes mellitus: Secondary | ICD-10-CM | POA: Diagnosis not present

## 2022-03-21 LAB — COMPREHENSIVE METABOLIC PANEL
ALT: 13 U/L (ref 0–35)
AST: 16 U/L (ref 0–37)
Albumin: 3.9 g/dL (ref 3.5–5.2)
Alkaline Phosphatase: 46 U/L (ref 39–117)
BUN: 13 mg/dL (ref 6–23)
CO2: 25 mEq/L (ref 19–32)
Calcium: 9.2 mg/dL (ref 8.4–10.5)
Chloride: 106 mEq/L (ref 96–112)
Creatinine, Ser: 0.86 mg/dL (ref 0.40–1.20)
GFR: 64.1 mL/min (ref >60.00)
Glucose, Bld: 86 mg/dL (ref 70–99)
Potassium: 4.4 mEq/L (ref 3.5–5.1)
Sodium: 143 mEq/L (ref 135–145)
Total Bilirubin: 0.4 mg/dL (ref 0.2–1.2)
Total Protein: 6.9 g/dL (ref 6.0–8.3)

## 2022-03-21 LAB — CBC
HCT: 36.1 % (ref 36.0–46.0)
Hemoglobin: 11.6 g/dL — ABNORMAL LOW (ref 12.0–15.0)
MCHC: 32.1 g/dL (ref 30.0–36.0)
MCV: 92 fl (ref 78.0–100.0)
Platelets: 164 10*3/uL (ref 150.0–400.0)
RBC: 3.92 Mil/uL (ref 3.87–5.11)
RDW: 13.5 % (ref 11.5–15.5)
WBC: 3.3 10*3/uL — ABNORMAL LOW (ref 4.0–10.5)

## 2022-03-21 LAB — LIPID PANEL
Cholesterol: 146 mg/dL (ref 0–200)
HDL: 61.2 mg/dL (ref >39.00)
LDL Cholesterol: 72 mg/dL (ref 0–99)
NonHDL: 84.88
Total CHOL/HDL Ratio: 2
Triglycerides: 62 mg/dL (ref 0.0–149.0)
VLDL: 12.4 mg/dL (ref 0.0–40.0)

## 2022-03-21 LAB — VITAMIN D 25 HYDROXY (VIT D DEFICIENCY, FRACTURES): VITD: 18.75 ng/mL — ABNORMAL LOW (ref 30.00–100.00)

## 2022-03-21 LAB — HEMOGLOBIN A1C: Hgb A1c MFr Bld: 5.5 % (ref 4.6–6.5)

## 2022-03-21 LAB — VITAMIN B12: Vitamin B-12: >1500 pg/mL — ABNORMAL HIGH (ref 211–911)

## 2022-03-21 MED ORDER — VITAMIN D3 1.25 MG (50000 UT) PO CAPS: ORAL_CAPSULE | ORAL | 0 refills | Status: AC

## 2022-03-21 NOTE — Addendum Note (Signed)
Addended by: Lamar Blinks C on: 03/21/2022 08:53 PM   Modules accepted: Orders

## 2022-03-21 NOTE — Telephone Encounter (Signed)
Rescheduled upcoming appointment due to provider's template. Patient is aware of changes. Mailed calendar.

## 2022-03-23 ENCOUNTER — Other Ambulatory Visit (HOSPITAL_BASED_OUTPATIENT_CLINIC_OR_DEPARTMENT_OTHER): Payer: Self-pay

## 2022-04-07 ENCOUNTER — Other Ambulatory Visit: Payer: Self-pay | Admitting: Family Medicine

## 2022-04-07 DIAGNOSIS — M2550 Pain in unspecified joint: Secondary | ICD-10-CM

## 2022-04-07 DIAGNOSIS — N644 Mastodynia: Secondary | ICD-10-CM

## 2022-04-07 DIAGNOSIS — M25562 Pain in left knee: Secondary | ICD-10-CM

## 2022-05-17 ENCOUNTER — Ambulatory Visit (INDEPENDENT_AMBULATORY_CARE_PROVIDER_SITE_OTHER): Payer: Medicare Other | Admitting: *Deleted

## 2022-05-17 DIAGNOSIS — Z0000 Encounter for general adult medical examination without abnormal findings: Secondary | ICD-10-CM | POA: Diagnosis not present

## 2022-05-17 NOTE — Patient Instructions (Signed)
[name] , Thank you for taking time to come for your Medicare Wellness Visit. I appreciate your ongoing commitment to your health goals. Please review the following plan we discussed and let me know if I can assist you in the future.   These are the goals we discussed:  Goals      Patient Stated     Maintain current health        This is a list of the screening recommended for you and due dates:  Health Maintenance  Topic Date Due   COVID-19 Vaccine (6 - Pfizer risk series) 09/21/2021   Tetanus Vaccine  11/09/2024   Pneumonia Vaccine  Completed   Flu Shot  Completed   DEXA scan (bone density measurement)  Completed   Hepatitis C Screening: USPSTF Recommendation to screen - Ages 44-79 yo.  Completed   Zoster (Shingles) Vaccine  Completed   HPV Vaccine  Aged Out     Next appointment: Follow up in one year for your annual wellness visit.   Preventive Care 82 Years and Older, Female Preventive care refers to lifestyle choices and visits with your health care provider that can promote health and wellness. What does preventive care include? A yearly physical exam. This is also called an annual well check. Dental exams once or twice a year. Routine eye exams. Ask your health care provider how often you should have your eyes checked. Personal lifestyle choices, including: Daily care of your teeth and gums. Regular physical activity. Eating a healthy diet. Avoiding tobacco and drug use. Limiting alcohol use. Practicing safe sex. Taking low-dose aspirin every day. Taking vitamin and mineral supplements as recommended by your health care provider. What happens during an annual well check? The services and screenings done by your health care provider during your annual well check will depend on your age, overall health, lifestyle risk factors, and family history of disease. Counseling  Your health care provider may ask you questions about your: Alcohol use. Tobacco use. Drug  use. Emotional well-being. Home and relationship well-being. Sexual activity. Eating habits. History of falls. Memory and ability to understand (cognition). Work and work Statistician. Reproductive health. Screening  You may have the following tests or measurements: Height, weight, and BMI. Blood pressure. Lipid and cholesterol levels. These may be checked every 5 years, or more frequently if you are over 59 years old. Skin check. Lung cancer screening. You may have this screening every year starting at age 9 if you have a 30-pack-year history of smoking and currently smoke or have quit within the past 15 years. Fecal occult blood test (FOBT) of the stool. You may have this test every year starting at age 64. Flexible sigmoidoscopy or colonoscopy. You may have a sigmoidoscopy every 5 years or a colonoscopy every 10 years starting at age 60. Hepatitis C blood test. Hepatitis B blood test. Sexually transmitted disease (STD) testing. Diabetes screening. This is done by checking your blood sugar (glucose) after you have not eaten for a while (fasting). You may have this done every 1-3 years. Bone density scan. This is done to screen for osteoporosis. You may have this done starting at age 51. Mammogram. This may be done every 1-2 years. Talk to your health care provider about how often you should have regular mammograms. Talk with your health care provider about your test results, treatment options, and if necessary, the need for more tests. Vaccines  Your health care provider may recommend certain vaccines, such as: Influenza vaccine.  This is recommended every year. Tetanus, diphtheria, and acellular pertussis (Tdap, Td) vaccine. You may need a Td booster every 10 years. Zoster vaccine. You may need this after age 36. Pneumococcal 13-valent conjugate (PCV13) vaccine. One dose is recommended after age 85. Pneumococcal polysaccharide (PPSV23) vaccine. One dose is recommended after age  69. Talk to your health care provider about which screenings and vaccines you need and how often you need them. This information is not intended to replace advice given to you by your health care provider. Make sure you discuss any questions you have with your health care provider. Document Released: 08/21/2015 Document Revised: 04/13/2016 Document Reviewed: 05/26/2015 Elsevier Interactive Patient Education  2017 [employer] Prevention in the Home Falls can cause injuries. They can happen to people of all ages. There are many things you can do to make your home safe and to help prevent falls. What can I do on the outside of my home? Regularly fix the edges of walkways and driveways and fix any cracks. Remove anything that might make you trip as you walk through a door, such as a raised step or threshold. Trim any bushes or trees on the path to your home. Use bright outdoor lighting. Clear any walking paths of anything that might make someone trip, such as rocks or tools. Regularly check to see if handrails are loose or broken. Make sure that both sides of any steps have handrails. Any raised decks and porches should have guardrails on the edges. Have any leaves, snow, or ice cleared regularly. Use sand or salt on walking paths during winter. Clean up any spills in your garage right away. This includes oil or grease spills. What can I do in the bathroom? Use night lights. Install grab bars by the toilet and in the tub and shower. Do not use towel bars as grab bars. Use non-skid mats or decals in the tub or shower. If you need to sit down in the shower, use a plastic, non-slip stool. Keep the floor dry. Clean up any water that spills on the floor as soon as it happens. Remove soap buildup in the tub or shower regularly. Attach bath mats securely with double-sided non-slip rug tape. Do not have throw rugs and other things on the floor that can make you trip. What can I do in the  bedroom? Use night lights. Make sure that you have a light by your bed that is easy to reach. Do not use any sheets or blankets that are too big for your bed. They should not hang down onto the floor. Have a firm chair that has side arms. You can use this for support while you get dressed. Do not have throw rugs and other things on the floor that can make you trip. What can I do in the kitchen? Clean up any spills right away. Avoid walking on wet floors. Keep items that you use a lot in easy-to-reach places. If you need to reach something above you, use a strong step stool that has a grab bar. Keep electrical cords out of the way. Do not use floor polish or wax that makes floors slippery. If you must use wax, use non-skid floor wax. Do not have throw rugs and other things on the floor that can make you trip. What can I do with my stairs? Do not leave any items on the stairs. Make sure that there are handrails on both sides of the stairs and use them. Fix handrails  that are broken or loose. Make sure that handrails are as long as the stairways. Check any carpeting to make sure that it is firmly attached to the stairs. Fix any carpet that is loose or worn. Avoid having throw rugs at the top or bottom of the stairs. If you do have throw rugs, attach them to the floor with carpet tape. Make sure that you have a light switch at the top of the stairs and the bottom of the stairs. If you do not have them, ask someone to add them for you. What else can I do to help prevent falls? Wear shoes that: Do not have high heels. Have rubber bottoms. Are comfortable and fit you well. Are closed at the toe. Do not wear sandals. If you use a stepladder: Make sure that it is fully opened. Do not climb a closed stepladder. Make sure that both sides of the stepladder are locked into place. Ask someone to hold it for you, if possible. Clearly mark and make sure that you can see: Any grab bars or  handrails. First and last steps. Where the edge of each step is. Use tools that help you move around (mobility aids) if they are needed. These include: Canes. Walkers. Scooters. Crutches. Turn on the lights when you go into a dark area. Replace any light bulbs as soon as they burn out. Set up your furniture so you have a clear path. Avoid moving your furniture around. If any of your floors are uneven, fix them. If there are any pets around you, be aware of where they are. Review your medicines with your doctor. Some medicines can make you feel dizzy. This can increase your chance of falling. Ask your doctor what other things that you can do to help prevent falls. This information is not intended to replace advice given to you by your health care provider. Make sure you discuss any questions you have with your health care provider. Document Released: 05/21/2009 Document Revised: 12/31/2015 Document Reviewed: 08/29/2014 Elsevier Interactive Patient Education  2017 [employer].

## 2022-05-17 NOTE — Progress Notes (Signed)
Subjective:   Tara Jimenez is a 81 y.o. female who presents for Medicare Annual (Subsequent) preventive examination.  I connected with  [name] on 05/17/22 by a audio enabled telemedicine application and verified that I am speaking with the correct person using two identifiers.  Patient Location: Home  Provider Location: Office/Clinic  I discussed the limitations of evaluation and management by telemedicine. The patient expressed understanding and agreed to proceed.   Review of Systems    Defer to PCP Cardiac Risk Factors include: advanced age (>18mn, >>3women);dyslipidemia     Objective:    There were no vitals filed for this visit. There is no height or weight on file to calculate BMI.     05/17/2022    9:42 AM 12/08/2021    6:44 PM 05/12/2021    9:45 AM 07/22/2015    9:54 AM  Advanced Directives  Does Patient Have a Medical Advance Directive? No No No No  Would patient like information on creating a medical advance directive? No - Patient declined  Yes (MAU/Ambulatory/Procedural Areas - Information given) No - patient declined information    Current Medications (verified) Outpatient Encounter Medications as of 05/17/2022  Medication Sig   b complex vitamins capsule Take 1 capsule by mouth daily.   Cholecalciferol (VITAMIN D3) 1.25 MG (50000 UT) CAPS Take 1 weekly for 12 weeks   Cyanocobalamin (B-12) 1000 MCG SUBL Place 1,000 mcg under the tongue 3 (three) times a week.   latanoprost (XALATAN) 0.005 % ophthalmic solution Place 1 drop into both eyes at bedtime.    meloxicam (MOBIC) 7.5 MG tablet Take 1 tablet (7.5 mg total) by mouth daily. Use as needed for pain   rosuvastatin (CRESTOR) 10 MG tablet TAKE 1 TABLET BY MOUTH EVERY DAY   Facility-Administered Encounter Medications as of 05/17/2022  Medication   0.9 %  sodium chloride infusion    Allergies (verified) Patient has no known allergies.   History: Past Medical History:  Diagnosis Date    Arthritis    Glaucoma    Hyperlipidemia    Past Surgical History:  Procedure Laterality Date   BRAIN SURGERY     COLONOSCOPY  2011   Family History  Problem Relation Age of Onset   Breast cancer Sister        473s  Colitis Sister 744  Diabetes Brother    Heart disease Brother    Hypertension Brother    Aneurysm Brother    Colon cancer Neg Hx    Esophageal cancer Neg Hx    Rectal cancer Neg Hx    Stomach cancer Neg Hx    Social History   Socioeconomic History   Marital status: Single    Spouse name: Not on file   Number of children: Not on file   Years of education: Not on file   Highest education level: Not on file  Occupational History   Not on file  Tobacco Use   Smoking status: Never   Smokeless tobacco: Never  Vaping Use   Vaping Use: Never used  Substance and Sexual Activity   Alcohol use: No    Alcohol/week: 0.0 standard drinks of alcohol   Drug use: No   Sexual activity: Not on file  Other Topics Concern   Not on file  Social History Narrative   Not on file   Social Determinants of Health   Financial Resource Strain: Low Risk  (05/12/2021)   Overall Financial Resource Strain (CARDIA)  Difficulty of Paying Living Expenses: Not hard at all  Food Insecurity: No Food Insecurity (05/12/2021)   Hunger Vital Sign    Worried About Running Out of Food in the Last Year: Never true    Ran Out of Food in the Last Year: Never true  Transportation Needs: No Transportation Needs (05/12/2021)   PRAPARE - Hydrologist (Medical): No    Lack of Transportation (Non-Medical): No  Physical Activity: Insufficiently Active (05/12/2021)   Exercise Vital Sign    Days of Exercise per Week: 7 days    Minutes of Exercise per Session: 10 min  Stress: No Stress Concern Present (05/12/2021)   [place]    Feeling of Stress : Not at all  Social Connections: Moderately Isolated  (05/12/2021)   Social Connection and Isolation Panel [NHANES]    Frequency of Communication with Friends and Family: More than three times a week    Frequency of Social Gatherings with Friends and Family: More than three times a week    Attends Religious Services: More than 4 times per year    Active Member of [employer] or Organizations: No    Attends Music therapist: Never    Marital Status: Never married    Tobacco Counseling Counseling given: Not Answered   Clinical Intake:  Pre-visit preparation completed: Yes  Pain : No/denies pain  How often do you need to have someone help you when you read instructions, pamphlets, or other written materials from your doctor or pharmacy?: 1 - Never  Diabetic? No  Activities of Daily Living    05/17/2022    9:43 AM  In your present state of health, do you have any difficulty performing the following activities:  Hearing? 0  Vision? 0  Difficulty concentrating or making decisions? 0  Walking or climbing stairs? 0  Dressing or bathing? 0  Doing errands, shopping? 0  Preparing Food and eating ? N  Using the Toilet? N  In the past six months, have you accidently leaked urine? N  Do you have problems with loss of bowel control? N  Managing your Medications? N  Managing your Finances? N  Housekeeping or managing your Housekeeping? N    Patient Care Team: Copland, Gay Filler, MD as PCP - General (Family Medicine) Marygrace Drought, MD as Consulting Physician (Ophthalmology)  Indicate any recent Medical Services you may have received from other than [facility] providers in the past year (date may be approximate).     Assessment:   This is a routine wellness examination for [facility].  Hearing/Vision screen No results found.  Dietary issues and exercise activities discussed: Current Exercise Habits: Home exercise routine, Type of exercise: Other - see comments (uses stair climber at home), Time (Minutes): 15, Frequency  (Times/Week): 3, Weekly Exercise (Minutes/Week): 45, Exercise limited by: None identified   Goals Addressed   None    Depression Screen    05/17/2022    9:42 AM 03/21/2022    8:58 AM 05/12/2021    9:47 AM 12/21/2020    8:52 AM 03/22/2017   10:16 AM 08/03/2016    8:16 AM 06/24/2015   10:17 AM  PHQ 2/9 Scores  PHQ - 2 Score 0 0 1 0 0 0 0  Exception Documentation     Patient refusal      Fall Risk    05/17/2022    9:42 AM 03/21/2022    8:58 AM 05/12/2021  9:46 AM 12/21/2020    8:52 AM 07/02/2019    9:02 AM  Fall Risk   Falls in the past year? 0 0 0 0 0  Comment     Emmi Telephone Survey: data to providers prior to load  Number falls in past yr: 0 0 0    Injury with Fall? 0 0 0    Risk for fall due to : No Fall Risks No Fall Risks     Follow up Falls evaluation completed Falls evaluation completed Falls prevention discussed      FALL RISK PREVENTION PERTAINING TO THE HOME:  Any stairs in or around the home? No  If so, are there any without handrails?  No stairs Home free of loose throw rugs in walkways, pet beds, electrical cords, etc? Yes  Adequate lighting in your home to reduce risk of falls? Yes   ASSISTIVE DEVICES UTILIZED TO PREVENT FALLS:  Life alert? No  Use of a cane, walker or w/c? No  Grab bars in the bathroom? No  Shower chair or bench in shower? Yes  Elevated toilet seat or a handicapped toilet? No   TIMED UP AND GO:  Was the test performed?  Audio visit .    Cognitive Function:        05/17/2022    9:46 AM  6CIT Screen  What Year? 0 points  What month? 0 points  What time? 0 points  Count back from 20 0 points  Months in reverse 0 points  Repeat phrase 0 points  Total Score 0 points    Immunizations Immunization History  Administered Date(s) Administered   Fluad Quad(high Dose 65+) 06/22/2020, 07/27/2021   Influenza Split 06/08/2016   Influenza, High Dose Seasonal PF 07/08/2017, 06/08/2018, 04/10/2022   Influenza,inj,Quad PF,6+ Mos  06/24/2015   PFIZER Comirnaty(Gray Top)Covid-19 Tri-Sucrose Vaccine 12/21/2020   PFIZER(Purple Top)SARS-COV-2 Vaccination 09/29/2019, 10/23/2019, 05/29/2020   Pfizer Covid-19 Vaccine Bivalent Booster 1yr & up 07/27/2021   Pneumococcal Conjugate-13 11/10/2014   Pneumococcal Polysaccharide-23 09/18/2017   Td 11/10/2014   Zoster Recombinat (Shingrix) 12/21/2020, 02/26/2021    TDAP status: Up to date  Flu Vaccine status: Up to date  Pneumococcal vaccine status: Up to date  Covid-19 vaccine status: Information provided on how to obtain vaccines.   Qualifies for Shingles Vaccine? Yes   Zostavax completed No   Shingrix Completed?: Yes  Screening Tests Health Maintenance  Topic Date Due   COVID-19 Vaccine (6 - Pfizer risk series) 09/21/2021   TETANUS/TDAP  11/09/2024   Pneumonia Vaccine 80 Years old  Completed   INFLUENZA VACCINE  Completed   DEXA SCAN  Completed   Hepatitis C Screening  Completed   Zoster Vaccines- Shingrix  Completed   HPV VACCINES  Aged Out    Health Maintenance  Health Maintenance Due  Topic Date Due   COVID-19 Vaccine (6 - Pfizer risk series) 09/21/2021    Colorectal cancer screening: Type of screening: Colonoscopy. Completed 02/26/20. Repeat every N/a years  Mammogram status: Completed 06/10/21. Repeat every year  Bone Density status: Completed 08/12/16. Results reflect: Bone density results: NORMAL. Repeat every 2 years.  Lung Cancer Screening: (Low Dose CT Chest recommended if Age 80-80years, 30 pack-year currently smoking OR have quit w/in 15years.) does not qualify.   Lung Cancer Screening Referral: N/a  Additional Screening:  Hepatitis C Screening: does qualify; Completed 06/22/20  Vision Screening: Recommended annual ophthalmology exams for early detection of glaucoma and other disorders of the eye. Is the patient  up to date with their annual eye exam?  Yes  Who is the provider or what is the name of the office in which the patient  attends annual eye exams? Dr. Satira Sark If pt is not established with a provider, would they like to be referred to a provider to establish care? No .   Dental Screening: Recommended annual dental exams for proper oral hygiene  Community Resource Referral / Chronic Care Management: CRR required this visit?  No   CCM required this visit?  No      Plan:     I have personally reviewed and noted the following in the patient's chart:   Medical and social history Use of alcohol, tobacco or illicit drugs  Current medications and supplements including opioid prescriptions. Patient is not currently taking opioid prescriptions. Functional ability and status Nutritional status Physical activity Advanced directives List of other physicians Hospitalizations, surgeries, and ER visits in previous 12 months Vitals Screenings to include cognitive, depression, and falls Referrals and appointments  In addition, I have reviewed and discussed with patient certain preventive protocols, quality metrics, and best practice recommendations. A written personalized care plan for preventive services as well as general preventive health recommendations were provided to patient.   Due to this being a telephonic visit, the after visit summary with patients personalized plan was offered to patient via mail or my-chart. Patient would like to access on my-chart.  Beatris Ship, [place]   05/17/2022   Nurse Notes: None

## 2022-06-08 ENCOUNTER — Other Ambulatory Visit: Payer: Self-pay | Admitting: Family Medicine

## 2022-06-08 DIAGNOSIS — M2550 Pain in unspecified joint: Secondary | ICD-10-CM

## 2022-06-08 DIAGNOSIS — N644 Mastodynia: Secondary | ICD-10-CM

## 2022-06-08 DIAGNOSIS — M25562 Pain in left knee: Secondary | ICD-10-CM

## 2022-06-14 ENCOUNTER — Encounter: Payer: Self-pay | Admitting: Family Medicine

## 2022-06-14 ENCOUNTER — Other Ambulatory Visit: Payer: Self-pay | Admitting: Family Medicine

## 2022-06-14 DIAGNOSIS — E559 Vitamin D deficiency, unspecified: Secondary | ICD-10-CM

## 2022-07-06 DIAGNOSIS — H5203 Hypermetropia, bilateral: Secondary | ICD-10-CM | POA: Diagnosis not present

## 2022-07-06 DIAGNOSIS — H2513 Age-related nuclear cataract, bilateral: Secondary | ICD-10-CM | POA: Diagnosis not present

## 2022-07-06 DIAGNOSIS — H25013 Cortical age-related cataract, bilateral: Secondary | ICD-10-CM | POA: Diagnosis not present

## 2022-07-06 DIAGNOSIS — H52203 Unspecified astigmatism, bilateral: Secondary | ICD-10-CM | POA: Diagnosis not present

## 2022-07-06 DIAGNOSIS — H43813 Vitreous degeneration, bilateral: Secondary | ICD-10-CM | POA: Diagnosis not present

## 2022-07-06 DIAGNOSIS — H401122 Primary open-angle glaucoma, left eye, moderate stage: Secondary | ICD-10-CM | POA: Diagnosis not present

## 2022-07-08 ENCOUNTER — Other Ambulatory Visit: Payer: Self-pay | Admitting: Family Medicine

## 2022-07-08 DIAGNOSIS — E559 Vitamin D deficiency, unspecified: Secondary | ICD-10-CM

## 2022-08-03 ENCOUNTER — Other Ambulatory Visit: Payer: Self-pay | Admitting: Family Medicine

## 2022-08-03 ENCOUNTER — Ambulatory Visit
Admission: RE | Admit: 2022-08-03 | Discharge: 2022-08-03 | Disposition: A | Discharge status: Home / Self Care | Payer: Medicare Other | Source: Ambulatory Visit | Attending: Family Medicine | Admitting: Family Medicine

## 2022-08-03 DIAGNOSIS — M2550 Pain in unspecified joint: Secondary | ICD-10-CM

## 2022-08-03 DIAGNOSIS — Z1231 Encounter for screening mammogram for malignant neoplasm of breast: Secondary | ICD-10-CM

## 2022-08-03 DIAGNOSIS — N644 Mastodynia: Secondary | ICD-10-CM

## 2022-08-03 DIAGNOSIS — M25562 Pain in left knee: Secondary | ICD-10-CM

## 2022-09-07 ENCOUNTER — Telehealth: Payer: Self-pay | Admitting: Hematology

## 2022-09-07 NOTE — Telephone Encounter (Signed)
Called patient per provider on call to reschedule appointments. Patient rescheduled and notified.

## 2022-09-19 ENCOUNTER — Other Ambulatory Visit: Payer: Medicare Other

## 2022-09-19 ENCOUNTER — Ambulatory Visit: Payer: Medicare Other | Admitting: Hematology

## 2022-09-19 NOTE — Patient Instructions (Incomplete)
Good to see you again today Recommend the latest covid vaccine if not done already Let's start on 25 mg of losartan daily for your BP- I am afraid your pressure is a bit too high in general  See me in 6 months assuming all is well!  I will be in touch with your labs asap

## 2022-09-19 NOTE — Progress Notes (Unsigned)
Tara Jimenez at [facility] [address], [place], [place] [address] [phone]  Date:  09/21/2022   Name:  Tara Jimenez   DOB:  07-Sep-1940   MRN:  [id number]  PCP:  Tara Mclean, MD    Chief Complaint: No chief complaint on file.   History of Present Illness:  Tara Jimenez is a 82 y.o. 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  She does have an AFO but prefers her other brace She has some atrophy of her right calf muscles Her husband died in 18-Oct-2020- her daughters live locally   Recommend covid booster Flu shot UTD  Crestor   She will follow-up with Dr Tara Jimenez soon for her MGUS recheck   Labs done in August-  Lipid,vit D, b12, CMP, CBC, A1c   Patient Active Problem List   Diagnosis Date Noted   Peripheral arterial disease ([place]) 03/21/2022   Dyslipidemia 12/13/2019   Right foot drop 08/03/2016   Borderline blood pressure 08/03/2016   MGUS (monoclonal gammopathy of unknown significance) 08/17/2015   Leukopenia 07/22/2015   Paresthesias 07/22/2015   Bone pain 07/22/2015   Microscopic hematuria 02/06/2015   Subdural hematoma ([place]) 02/19/2010   OBESITY 02/15/2010   GERD 02/15/2010   ARTHRITIS 02/15/2010   UTI'S, HX OF 02/15/2010    Past Medical History:  Diagnosis Date   Arthritis    Glaucoma    Hyperlipidemia     Past Surgical History:  Procedure Laterality Date   BRAIN SURGERY     COLONOSCOPY  10-18-2009    Social History   Tobacco Use   Smoking status: Never   Smokeless tobacco: Never  Vaping Use   Vaping Use: Never used  Substance Use Topics   Alcohol use: No    Alcohol/week: 0.0 standard drinks of  alcohol   Drug use: No    Family History  Problem Relation Age of Onset   Breast cancer Sister        28s   Colitis Sister 73   Diabetes Brother    Heart disease Brother    Hypertension Brother    Aneurysm Brother    Colon cancer Neg Hx    Esophageal cancer Neg Hx    Rectal cancer Neg Hx    Stomach cancer Neg Hx     No Known Allergies  Medication list has been reviewed and updated.  Current Outpatient Medications on File Prior to Visit  Medication Sig Dispense Refill   b complex vitamins capsule Take 1 capsule by mouth daily. 30 capsule 11   Cholecalciferol (VITAMIN D3) 1.25 MG (50000 UT) CAPS Take 1 weekly for 12 weeks 12 capsule 0   Cyanocobalamin (B-12) 1000 MCG SUBL Place 1,000 mcg under the tongue 3 (three) times a week. 30 tablet 11   latanoprost (XALATAN) 0.005 % ophthalmic solution Place 1 drop into both eyes at bedtime.      meloxicam (MOBIC) 7.5 MG tablet TAKE 1 TABLET (7.5 MG TOTAL) BY MOUTH DAILY. USE AS NEEDED FOR PAIN 30 tablet 1   rosuvastatin (CRESTOR) 10 MG tablet TAKE 1 TABLET BY MOUTH EVERY DAY 90 tablet 3   Current  Facility-Administered Medications on File Prior to Visit  Medication Dose Route Frequency Provider Last Rate Last Admin   0.9 %  sodium chloride infusion  500 mL Intravenous Once Tara Banister, MD        Review of Systems:  As per HPI- otherwise negative.   Physical Examination: There were no vitals filed for this visit. There were no vitals filed for this visit. There is no height or weight on file to calculate BMI. Ideal Body Weight:    GEN: no acute distress. HEENT: Atraumatic, Normocephalic.  Ears and Nose: No external deformity. CV: RRR, No M/G/R. No JVD. No thrill. No extra heart sounds. PULM: CTA B, no wheezes, crackles, rhonchi. No retractions. No resp. distress. No accessory muscle use. ABD: S, NT, ND, +BS. No rebound. No HSM. EXTR: No c/c/e PSYCH: Normally interactive. Conversant.    Assessment and  Plan: ***  Signed Tara Blinks, MD

## 2022-09-21 ENCOUNTER — Encounter: Payer: Self-pay | Admitting: Family Medicine

## 2022-09-21 ENCOUNTER — Ambulatory Visit (INDEPENDENT_AMBULATORY_CARE_PROVIDER_SITE_OTHER): Payer: 59 | Admitting: Family Medicine

## 2022-09-21 VITALS — BP 160/90 | HR 69 | Temp 98.8°F | Resp 18 | Ht 66.0 in | Wt 195.6 lb

## 2022-09-21 DIAGNOSIS — D472 Monoclonal gammopathy: Secondary | ICD-10-CM

## 2022-09-21 DIAGNOSIS — E785 Hyperlipidemia, unspecified: Secondary | ICD-10-CM

## 2022-09-21 DIAGNOSIS — I10 Essential (primary) hypertension: Secondary | ICD-10-CM | POA: Diagnosis not present

## 2022-09-21 DIAGNOSIS — E559 Vitamin D deficiency, unspecified: Secondary | ICD-10-CM | POA: Diagnosis not present

## 2022-09-21 LAB — CBC
HCT: 37.3 % (ref 36.0–46.0)
Hemoglobin: 12.2 g/dL (ref 12.0–15.0)
MCHC: 32.7 g/dL (ref 30.0–36.0)
MCV: 91.4 fl (ref 78.0–100.0)
Platelets: 177 10*3/uL (ref 150.0–400.0)
RBC: 4.08 Mil/uL (ref 3.87–5.11)
RDW: 13.7 % (ref 11.5–15.5)
WBC: 3.6 10*3/uL — ABNORMAL LOW (ref 4.0–10.5)

## 2022-09-21 LAB — BASIC METABOLIC PANEL
BUN: 13 mg/dL (ref 6–23)
CO2: 26 mEq/L (ref 19–32)
Calcium: 9.3 mg/dL (ref 8.4–10.5)
Chloride: 105 mEq/L (ref 96–112)
Creatinine, Ser: 0.89 mg/dL (ref 0.40–1.20)
GFR: 61.3 mL/min (ref >60.00)
Glucose, Bld: 77 mg/dL (ref 70–99)
Potassium: 4.1 mEq/L (ref 3.5–5.1)
Sodium: 141 mEq/L (ref 135–145)

## 2022-09-21 LAB — VITAMIN D 25 HYDROXY (VIT D DEFICIENCY, FRACTURES): VITD: 30.82 ng/mL (ref 30.00–100.00)

## 2022-09-21 MED ORDER — LOSARTAN POTASSIUM 25 MG PO TABS: 25.0000 mg | ORAL_TABLET | Freq: Every day | ORAL | 3 refills | Status: DC

## 2022-09-26 DIAGNOSIS — H2513 Age-related nuclear cataract, bilateral: Secondary | ICD-10-CM | POA: Diagnosis not present

## 2022-09-26 DIAGNOSIS — H401132 Primary open-angle glaucoma, bilateral, moderate stage: Secondary | ICD-10-CM | POA: Diagnosis not present

## 2022-09-28 ENCOUNTER — Other Ambulatory Visit: Payer: Self-pay | Admitting: Family Medicine

## 2022-09-28 ENCOUNTER — Other Ambulatory Visit: Payer: Self-pay

## 2022-09-28 ENCOUNTER — Inpatient Hospital Stay: Payer: 59 | Attending: Hematology

## 2022-09-28 ENCOUNTER — Inpatient Hospital Stay (HOSPITAL_BASED_OUTPATIENT_CLINIC_OR_DEPARTMENT_OTHER): Payer: 59 | Admitting: Hematology

## 2022-09-28 VITALS — BP 154/69 | HR 73 | Temp 97.3°F | Resp 20 | Wt 196.2 lb

## 2022-09-28 DIAGNOSIS — Z79899 Other long term (current) drug therapy: Secondary | ICD-10-CM | POA: Insufficient documentation

## 2022-09-28 DIAGNOSIS — K219 Gastro-esophageal reflux disease without esophagitis: Secondary | ICD-10-CM | POA: Diagnosis not present

## 2022-09-28 DIAGNOSIS — M25562 Pain in left knee: Secondary | ICD-10-CM

## 2022-09-28 DIAGNOSIS — D472 Monoclonal gammopathy: Secondary | ICD-10-CM

## 2022-09-28 DIAGNOSIS — H409 Unspecified glaucoma: Secondary | ICD-10-CM | POA: Insufficient documentation

## 2022-09-28 DIAGNOSIS — N644 Mastodynia: Secondary | ICD-10-CM

## 2022-09-28 DIAGNOSIS — M1990 Unspecified osteoarthritis, unspecified site: Secondary | ICD-10-CM | POA: Insufficient documentation

## 2022-09-28 DIAGNOSIS — M2550 Pain in unspecified joint: Secondary | ICD-10-CM

## 2022-09-28 LAB — CBC WITH DIFFERENTIAL (CANCER CENTER ONLY)
Abs Immature Granulocytes: 0 10*3/uL (ref 0.00–0.07)
Basophils Absolute: 0 10*3/uL (ref 0.0–0.1)
Basophils Relative: 1 %
Eosinophils Absolute: 0.1 10*3/uL (ref 0.0–0.5)
Eosinophils Relative: 2 %
HCT: 37.1 % (ref 36.0–46.0)
Hemoglobin: 12 g/dL (ref 12.0–15.0)
Immature Granulocytes: 0 %
Lymphocytes Relative: 32 %
Lymphs Abs: 1.4 10*3/uL (ref 0.7–4.0)
MCH: 29.4 pg (ref 26.0–34.0)
MCHC: 32.3 g/dL (ref 30.0–36.0)
MCV: 90.9 fL (ref 80.0–100.0)
Monocytes Absolute: 0.3 10*3/uL (ref 0.1–1.0)
Monocytes Relative: 8 %
Neutro Abs: 2.5 10*3/uL (ref 1.7–7.7)
Neutrophils Relative %: 57 %
Platelet Count: 174 10*3/uL (ref 150–400)
RBC: 4.08 MIL/uL (ref 3.87–5.11)
RDW: 12.7 % (ref 11.5–15.5)
WBC Count: 4.3 10*3/uL (ref 4.0–10.5)
nRBC: 0 % (ref 0.0–0.2)

## 2022-09-28 LAB — CMP (CANCER CENTER ONLY)
ALT: 16 U/L (ref 0–44)
AST: 21 U/L (ref 15–41)
Albumin: 3.9 g/dL (ref 3.5–5.0)
Alkaline Phosphatase: 43 U/L (ref 38–126)
Anion gap: 9 (ref 5–15)
BUN: 14 mg/dL (ref 8–23)
CO2: 27 mmol/L (ref 22–32)
Calcium: 8.8 mg/dL — ABNORMAL LOW (ref 8.9–10.3)
Chloride: 107 mmol/L (ref 98–111)
Creatinine: 0.93 mg/dL (ref 0.44–1.00)
GFR, Estimated: >60 mL/min (ref >60)
Glucose, Bld: 100 mg/dL — ABNORMAL HIGH (ref 70–99)
Potassium: 3.8 mmol/L (ref 3.5–5.1)
Sodium: 143 mmol/L (ref 135–145)
Total Bilirubin: 0.5 mg/dL (ref 0.3–1.2)
Total Protein: 7.1 g/dL (ref 6.5–8.1)

## 2022-09-28 NOTE — Progress Notes (Signed)
Tara Jimenez   HEMATOLOGY/ONCOLOGY CONSULTATION NOTE  Date of Service: 09/28/22   Patient Care Team: Copland, Tara Filler, MD as PCP - General (Family Medicine) Tara Drought, MD as Consulting Physician (Ophthalmology)  CHIEF COMPLAINTS/PURPOSE OF CONSULTATION:   Phone visit to discuss monoclonal paraproteinemia work-up  HISTORY OF PRESENTING ILLNESS:   Tara Jimenez is a wonderful 82 y.o. female who has been referred to Tara Jimenez by Dr .Lorelei Pont, Tara Filler, MD for evaluation and management of monoclonal paraproteinemia.  Patient has a history of glaucoma, GERD, arthritis, subdural hematoma and 2011 and a previous history of MGUS Who was last seen in clinic by Tara Jimenez on 09/15/2016 for leukopenia/neutropenia and IgA MGUS. 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 She was also noted to have IgA kappa MGUS with an M spike of 0.3 g/dL.  Subsequent myeloma panel had shown IgA kappa levels not detectable but IFE still positive. She was supposed to follow-up with her primary care physician in 6 months with a repeat SPEP and come back to see Tara Jimenez in 1 year. She however did not continue to follow with Tara Jimenez and has been referred back to to reestablish care with Tara Jimenez.  Patient notes no recent acute new symptoms.  No focal bone pains.  No new fatigue. No infection issues. Notes that she has felt somewhat down since she lost her husband last year. No fevers no chills no night sweats no unexpected sudden weight loss.  INTERVAL HISTORY Tara Jimenez is a wonderful 82 y.o. female who is here for continued evaluation and management of monoclonal paraproteinemia.  I had phone visit with the patient on 09/14/2021 and he was doing well overall.   Patient reports she has been doing well overall without any new medical concerns since our last visit. She  denies fever, chills, night sweats, fatigue, unexpected weight loss, abnormal bowel movement, urinary problems, abdominal pain, bone pain, chest pain, shortness of breath, back pain, or leg swelling. She does complain of occasional shoulder and knee pain. She is currently taking meloxicam 7.5 mg as needed for pain.  Patient has been started on Losartan 25 mg for blood pressure since our last visit. Her blood pressure during this visit was 154/69. She is currently taking Vitamin-D supplement.    MEDICAL HISTORY:  Past Medical History:  Diagnosis Date   Arthritis    Glaucoma    Hyperlipidemia     SURGICAL HISTORY: Past Surgical History:  Procedure Laterality Date   BRAIN SURGERY     COLONOSCOPY  2011    SOCIAL HISTORY: Social History   Socioeconomic History   Marital status: Single    Spouse name: Not on file   Number of children: Not on file   Years of education: Not on file   Highest education level: Not on file  Occupational History   Not on file  Tobacco Use   Smoking status: Never   Smokeless tobacco: Never  Vaping Use   Vaping Use: Never used  Substance and Sexual Activity   Alcohol use: No    Alcohol/week: 0.0 standard drinks of alcohol   Drug use: No   Sexual activity: Not on file  Other Topics Concern   Not on file  Social History Narrative   Not on file   Social Determinants of Health   Financial Resource Strain: Low Risk  (05/12/2021)   Overall Financial  Resource Strain (CARDIA)    Difficulty of Paying Living Expenses: Not hard at all  Food Insecurity: No Food Insecurity (05/12/2021)   Hunger Vital Sign    Worried About Running Out of Food in the Last Year: Never true    Ran Out of Food in the Last Year: Never true  Transportation Needs: No Transportation Needs (05/12/2021)   PRAPARE - Hydrologist (Medical): No    Lack of Transportation (Non-Medical): No  Physical Activity: Insufficiently Active (05/12/2021)   Exercise  Vital Sign    Days of Exercise per Week: 7 days    Minutes of Exercise per Session: 10 min  Stress: No Stress Concern Present (05/12/2021)   [place]    Feeling of Stress : Not at all  Social Connections: Moderately Isolated (05/12/2021)   Social Connection and Isolation Panel [NHANES]    Frequency of Communication with Friends and Family: More than three times a week    Frequency of Social Gatherings with Friends and Family: More than three times a week    Attends Religious Services: More than 4 times per year    Active Member of [employer] or Organizations: No    Attends Archivist Meetings: Never    Marital Status: Never married  Intimate Partner Violence: Not At Risk (05/12/2021)   Humiliation, Afraid, Rape, and Kick questionnaire    Fear of Current or Ex-Partner: No    Emotionally Abused: No    Physically Abused: No    Sexually Abused: No    FAMILY HISTORY: Family History  Problem Relation Age of Onset   Breast cancer Sister        58s   Colitis Sister 62   Diabetes Brother    Heart disease Brother    Hypertension Brother    Aneurysm Brother    Colon cancer Neg Hx    Esophageal cancer Neg Hx    Rectal cancer Neg Hx    Stomach cancer Neg Hx     ALLERGIES:  has No Known Allergies.  MEDICATIONS:  Current Outpatient Medications  Medication Sig Dispense Refill   b complex vitamins capsule Take 1 capsule by mouth daily. 30 capsule 11   Cholecalciferol (VITAMIN D3) 1.25 MG (50000 UT) CAPS Take 1 weekly for 12 weeks 12 capsule 0   Cyanocobalamin (B-12) 1000 MCG SUBL Place 1,000 mcg under the tongue 3 (three) times a week. 30 tablet 11   latanoprost (XALATAN) 0.005 % ophthalmic solution Place 1 drop into both eyes at bedtime.      losartan (COZAAR) 25 MG tablet Take 1 tablet (25 mg total) by mouth daily. 90 tablet 3   meloxicam (MOBIC) 7.5 MG tablet TAKE 1 TABLET (7.5 MG TOTAL) BY MOUTH DAILY. USE AS  NEEDED FOR PAIN 30 tablet 1   rosuvastatin (CRESTOR) 10 MG tablet TAKE 1 TABLET BY MOUTH EVERY DAY 90 tablet 3   Current Facility-Administered Medications  Medication Dose Route Frequency Provider Last Rate Last Admin   0.9 %  sodium chloride infusion  500 mL Intravenous Once Milus Banister, MD        PHYSICAL EXAMINATION: .BP (!) 154/69   Pulse 73   Temp (!) 97.3 F (36.3 C)   Resp 20   Wt 196 lb 3.2 oz (89 kg)   SpO2 97%   BMI 31.67 kg/m  . GENERAL:alert, in no acute distress and comfortable SKIN: no acute rashes, no significant lesions  EYES: conjunctiva are pink and non-injected, sclera anicteric OROPHARYNX: MMM, no exudates, no oropharyngeal erythema or ulceration NECK: supple, no JVD LYMPH:  no palpable lymphadenopathy in the cervical, axillary or inguinal regions LUNGS: clear to auscultation b/l with normal respiratory effort HEART: regular rate & rhythm ABDOMEN:  normoactive bowel sounds , non tender, not distended. Extremity: no pedal edema PSYCH: alert & oriented x 3 with fluent speech NEURO: no focal motor/sensory deficits    LABORATORY DATA:  I have reviewed the data as listed  .    Latest Ref Rng & Units 09/28/2022    1:45 PM 09/21/2022    9:10 AM 03/21/2022    9:17 AM  CBC  WBC 4.0 - 10.5 K/uL 4.3  3.6  3.3   Hemoglobin 12.0 - 15.0 g/dL 12.0  12.2  11.6   Hematocrit 36.0 - 46.0 % 37.1  37.3  36.1   Platelets 150 - 400 K/uL 174  177.0  164.0    .CBC    Component Value Date/Time   WBC 3.6 (L) 09/21/2022 0910   RBC 4.08 09/21/2022 0910   HGB 12.2 09/21/2022 0910   HGB 12.1 09/15/2016 0946   HCT 37.3 09/21/2022 0910   HCT 38.4 09/15/2016 0946   PLT 177.0 09/21/2022 0910   PLT 194 09/15/2016 0946   MCV 91.4 09/21/2022 0910   MCV 93.7 09/15/2016 0946   MCH 29.2 09/07/2021 1138   MCHC 32.7 09/21/2022 0910   RDW 13.7 09/21/2022 0910   RDW 13.3 09/15/2016 0946   LYMPHSABS 1.1 09/07/2021 1138   LYMPHSABS 1.4 09/15/2016 0946   MONOABS 0.3  09/07/2021 1138   MONOABS 0.2 09/15/2016 0946   EOSABS 0.1 09/07/2021 1138   EOSABS 0.1 09/15/2016 0946   BASOSABS 0.0 09/07/2021 1138   BASOSABS 0.0 09/15/2016 0946    .    Latest Ref Rng & Units 09/28/2022    1:45 PM 09/21/2022    9:10 AM 03/21/2022    9:17 AM  CMP  Glucose 70 - 99 mg/dL 100  77  86   BUN 8 - 23 mg/dL '14  13  13   '$ Creatinine 0.44 - 1.00 mg/dL 0.93  0.89  0.86   Sodium 135 - 145 mmol/L 143  141  143   Potassium 3.5 - 5.1 mmol/L 3.8  4.1  4.4   Chloride 98 - 111 mmol/L 107  105  106   CO2 22 - 32 mmol/L '27  26  25   '$ Calcium 8.9 - 10.3 mg/dL 8.8  9.3  9.2   Total Protein 6.5 - 8.1 g/dL 7.1   6.9   Total Bilirubin 0.3 - 1.2 mg/dL 0.5   0.4   Alkaline Phos 38 - 126 U/L 43   46   AST 15 - 41 U/L 21   16   ALT 0 - 44 U/L 16   13    Component     Latest Ref Rng & Units 09/07/2021  Sodium     135 - 145 mEq/L   Potassium     3.5 - 5.1 mEq/L   Chloride     96 - 112 mEq/L   CO2     19 - 32 mEq/L   Glucose     70 - 99 mg/dL   BUN     6 - 23 mg/dL   Creatinine     0.40 - 1.20 mg/dL   Total Bilirubin     0.2 - 1.2 mg/dL   Alkaline Phosphatase  39 - 117 U/L   AST     0 - 37 U/L   ALT     0 - 35 U/L   Total Protein     6.0 - 8.3 g/dL   Albumin     3.5 - 5.2 g/dL   GFR     >60.00 mL/min   Calcium     8.4 - 10.5 mg/dL   IgG (Immunoglobin G), Serum     586 - 1,602 mg/dL 1,315  IgA     64 - 422 mg/dL 568 (H)  IgM (Immunoglobulin M), Srm     26 - 217 mg/dL 22 (L)  Total Protein ELP     6.0 - 8.5 g/dL 7.0  Albumin SerPl Elph-Mcnc     2.9 - 4.4 g/dL 3.7  Alpha 1     0.0 - 0.4 g/dL 0.2  Alpha2 Glob SerPl Elph-Mcnc     0.4 - 1.0 g/dL 0.9  B-Globulin SerPl Elph-Mcnc     0.7 - 1.3 g/dL 0.9  Gamma Glob SerPl Elph-Mcnc     0.4 - 1.8 g/dL 1.2  M Protein SerPl Elph-Mcnc     Not Observed g/dL Comment (A)  Globulin, Total     2.2 - 3.9 g/dL 3.3  Albumin/Glob SerPl     0.7 - 1.7 1.2  IFE 1      Comment (A)  Please Note (HCV):      Comment  Kappa  free light chain     3.3 - 19.4 mg/L 35.4 (H)  Lambda free light chains     5.7 - 26.3 mg/L 22.4  Kappa, lambda light chain ratio     0.26 - 1.65 1.58  Copper     80 - 158 ug/dL 131  Folate     >5.9 ng/mL 16.7  Vitamin B12     180 - 914 pg/mL 267   Surgical Pathology  CASE: [id number]  PATIENT: Charlsey Grosshans  Flow Pathology Report      Clinical history: MGUS, neutropenia    DIAGNOSIS:   -  No monoclonal B-cell population identified  -  Gamma/delta T-cell population comprises 13% of all lymphocytes  -  See comment   COMMENT:   Review of the peripheral blood shows a leukopenia with a slight  predominance of neutrophils and relatively increased lymphocytes which  have a polymorphous appearance.   A subset of the gamma/delta T-cell population does not express CD5.  This population is likely reactive in nature; clinical correlation is  recommended.   RADIOGRAPHIC STUDIES: I have personally reviewed the radiological images as listed and agreed with the findings in the report. No results found.  ASSESSMENT & PLAN:   82 year old female with  #1 history of IgA kappa MGUS noted previously in 2017 Patient was lost to follow-up Her last myeloma panel showed no M spike but IFE positive for IgA kappa Patient has no acute new symptoms or focal bone pains.  No acute new fatigue. Plan -Abdomen 09/07/2021 were reviewed with the patient. CBC shows normal hemoglobin and only mild leukopenia of 3.3k Myeloma panel is just positive on IFE with M spike not measurable since it is, getting with the beta fraction -No clinical signs or symptoms or lab findings to suggest progression to multiple myeloma at this time.  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 daily -Also start taking B complex 1 capsule p.o. daily -Recommended avoiding NSAIDs since the patient was on chronic meloxicam which can also cause leukopenia/neutropenia. -myeloma panel - show stable M spike of 0.3g.dl. no evidence of progression to myeloma.. 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   -Flow cytometry done.  Gamma delta T-cell subset appears reactive.  PLAN: -Discussed lab results from today, 09/28/2022, with the patient. CBC is stable. CMP is stable except slightly decreased calcium levels at 8.8. -Patient will get lab work with PCP and see Tara Jimenez as needed.   FOLLOW-UP: RTC with PCP  The total time spent in the appointment was 20 minutes* .  All of the patient's questions were answered with apparent satisfaction. The patient knows to call the clinic with any problems, questions or concerns.   Sullivan Lone MD MS AAHIVMS [facility] Hematology/Oncology Physician [facility]  .*Total Encounter Time as defined by the Centers for Medicare and Medicaid Services includes, in addition to the face-to-face time of a patient visit (documented in the note above) non-face-to-face time: obtaining and reviewing outside history, ordering and reviewing medications, tests or procedures, care coordination (communications with other health care professionals or caregivers) and documentation in the medical record.   I, Cleda Mccreedy, am acting as a Education administrator for Sullivan Lone, MD. .I have reviewed the above documentation for accuracy and completeness, and I agree with the above. Brunetta Genera MD

## 2022-09-29 LAB — KAPPA/LAMBDA LIGHT CHAINS
Kappa free light chain: 28.8 mg/L — ABNORMAL HIGH (ref 3.3–19.4)
Kappa, lambda light chain ratio: 1.36 (ref 0.26–1.65)
Lambda free light chains: 21.2 mg/L (ref 5.7–26.3)

## 2022-10-04 LAB — MULTIPLE MYELOMA PANEL, SERUM
Albumin SerPl Elph-Mcnc: 3.7 g/dL (ref 2.9–4.4)
Albumin/Glob SerPl: 1.2 (ref 0.7–1.7)
Alpha 1: 0.2 g/dL (ref 0.0–0.4)
Alpha2 Glob SerPl Elph-Mcnc: 0.7 g/dL (ref 0.4–1.0)
B-Globulin SerPl Elph-Mcnc: 1.1 g/dL (ref 0.7–1.3)
Gamma Glob SerPl Elph-Mcnc: 1.2 g/dL (ref 0.4–1.8)
Globulin, Total: 3.1 g/dL (ref 2.2–3.9)
IgA: 600 mg/dL — ABNORMAL HIGH (ref 64–422)
IgG (Immunoglobin G), Serum: 1242 mg/dL (ref 586–1602)
IgM (Immunoglobulin M), Srm: 22 mg/dL — ABNORMAL LOW (ref 26–217)
M Protein SerPl Elph-Mcnc: 0.3 g/dL — ABNORMAL HIGH
Total Protein ELP: 6.8 g/dL (ref 6.0–8.5)

## 2022-10-13 ENCOUNTER — Other Ambulatory Visit: Payer: Self-pay | Admitting: Hematology

## 2022-11-27 ENCOUNTER — Other Ambulatory Visit: Payer: Self-pay | Admitting: Family Medicine

## 2022-11-27 DIAGNOSIS — M2550 Pain in unspecified joint: Secondary | ICD-10-CM

## 2022-11-27 DIAGNOSIS — M25562 Pain in left knee: Secondary | ICD-10-CM

## 2022-11-27 DIAGNOSIS — N644 Mastodynia: Secondary | ICD-10-CM

## 2022-11-28 ENCOUNTER — Other Ambulatory Visit: Payer: Self-pay | Admitting: Hematology

## 2023-01-22 ENCOUNTER — Other Ambulatory Visit: Payer: Self-pay | Admitting: Family Medicine

## 2023-01-22 DIAGNOSIS — N644 Mastodynia: Secondary | ICD-10-CM

## 2023-01-22 DIAGNOSIS — M25562 Pain in left knee: Secondary | ICD-10-CM

## 2023-01-22 DIAGNOSIS — M2550 Pain in unspecified joint: Secondary | ICD-10-CM

## 2023-01-31 DIAGNOSIS — H401132 Primary open-angle glaucoma, bilateral, moderate stage: Secondary | ICD-10-CM | POA: Diagnosis not present

## 2023-01-31 DIAGNOSIS — H2513 Age-related nuclear cataract, bilateral: Secondary | ICD-10-CM | POA: Diagnosis not present

## 2023-03-02 ENCOUNTER — Other Ambulatory Visit: Payer: Self-pay | Admitting: Family Medicine

## 2023-03-02 DIAGNOSIS — E785 Hyperlipidemia, unspecified: Secondary | ICD-10-CM

## 2023-03-08 ENCOUNTER — Encounter (INDEPENDENT_AMBULATORY_CARE_PROVIDER_SITE_OTHER): Payer: Self-pay

## 2023-03-20 ENCOUNTER — Other Ambulatory Visit: Payer: Self-pay | Admitting: Family Medicine

## 2023-03-20 DIAGNOSIS — M2550 Pain in unspecified joint: Secondary | ICD-10-CM

## 2023-03-20 DIAGNOSIS — M25562 Pain in left knee: Secondary | ICD-10-CM

## 2023-03-20 DIAGNOSIS — N644 Mastodynia: Secondary | ICD-10-CM

## 2023-03-23 NOTE — Patient Instructions (Addendum)
Good to see you again today Recommend flu shot and covid booster this fall I will be in touch with your labs asap Assuming all is well please see me in about 6 months Continue to monitor your BP at home- if your home numbers are going up please alert me   Work on getting weight back closer to 190 lbs

## 2023-03-23 NOTE — Progress Notes (Signed)
[facility] [address], [address] [place], [place] [address] [phone]  Date:  03/27/2023   Name:  Tara Jimenez   DOB:  12/11/1940   MRN:  [id number]  PCP:  Pearline Cables, MD    Chief Complaint: 6 month follow up (Concerns/ questions: /AWV due)   History of Present Illness:  Tara Jimenez is a 82 y.o. very pleasant female patient who presents with the following:  Pt seen today for 6 month recheck - history of MGUS, subdural hematoma in 2011, arthritis and GERD, glaucoma managed by optho Dr Burgess Estelle, dyslipidemia, PAD  Chronic right foot drop since birth  Her BP tends to be ok when she checks it at home, elevated at office Most recent visit with myself was in February  She continues to check her BP at home- generally running 140/70  Seen by her oncologist, Dr Candise Che in February also - it looks like they were concerned about low calcium ASSESSMENT & PLAN:  #1 history of IgA kappa MGUS noted previously in 2017 Patient was lost to follow-up Her last myeloma panel showed no M spike but IFE positive for IgA kappa Patient has no acute new symptoms or focal bone pains.  No acute new fatigue. Plan -Abdomen 09/07/2021 were reviewed with the patient. CBC shows normal hemoglobin and only mild leukopenia of 3.3k Myeloma panel is just positive on IFE with M spike not measurable since it is, getting with the beta fraction -No clinical signs or symptoms or lab findings to suggest progression to multiple myeloma at this time.   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 daily -Also start taking B complex 1 capsule p.o. daily -Recommended avoiding NSAIDs since the patient was on chronic  meloxicam which can also cause leukopenia/neutropenia. -myeloma panel - show stable M spike of 0.3g.dl. no evidence of progression to myeloma.. 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   -Flow cytometry done.  Gamma delta T-cell subset appears reactive. PLAN: -Discussed lab results from today, 09/28/2022, with the patient. CBC is stable. CMP is stable except slightly decreased calcium levels at 8.8. -Patient will get lab work with PCP and see [place] as needed.  FOLLOW-UP: RTC with PCP  Recommend covid and flu this fall   Crestor CMP, CBC done in February  Mammo is UTD She declines a bone density right now  Her colon is UTD - likely will not need to repeat   She has gained a little weight over the summer- she will work on getting this back down  Wt Readings from Last 3 Encounters:  03/27/23 199 lb 9.6 oz (90.5 kg)  09/28/22 196 lb 3.2 oz (89 kg)  09/21/22 195 lb 9.6 oz (88.7 kg)   BP Readings from Last 3 Encounters:  03/27/23 (!) 160/90  09/28/22 (!) 154/69  09/21/22 (!) 160/90     Patient Active Problem List   Diagnosis Date Noted   Peripheral arterial disease (HCC) 03/21/2022   Dyslipidemia 12/13/2019   Right foot drop 08/03/2016   Borderline blood pressure 08/03/2016   MGUS (monoclonal gammopathy of unknown significance) 08/17/2015   Leukopenia 07/22/2015  Paresthesias 07/22/2015   Bone pain 07/22/2015   Microscopic hematuria 02/06/2015   Subdural hematoma (HCC) 02/19/2010   OBESITY 02/15/2010   GERD 02/15/2010   ARTHRITIS 02/15/2010   UTI'S, HX OF 02/15/2010    Past Medical History:  Diagnosis Date   Arthritis    Glaucoma    Hyperlipidemia     Past Surgical History:  Procedure Laterality Date   BRAIN SURGERY     COLONOSCOPY  2011    Social History   Tobacco Use   Smoking status: Never   Smokeless tobacco: Never  Vaping Use   Vaping status: Never Used   Substance Use Topics   Alcohol use: No    Alcohol/week: 0.0 standard drinks of alcohol   Drug use: No    Family History  Problem Relation Age of Onset   Breast cancer Sister        48s   Colitis Sister 5   Diabetes Brother    Heart disease Brother    Hypertension Brother    Aneurysm Brother    Colon cancer Neg Hx    Esophageal cancer Neg Hx    Rectal cancer Neg Hx    Stomach cancer Neg Hx     No Known Allergies  Medication list has been reviewed and updated.  Current Outpatient Medications on File Prior to Visit  Medication Sig Dispense Refill   b complex vitamins capsule TAKE 1 CAPSULE BY MOUTH EVERY DAY 100 capsule 3   Cholecalciferol (VITAMIN D3) 1.25 MG (50000 UT) CAPS Take 1 weekly for 12 weeks 12 capsule 0   cyanocobalamin (VITAMIN B12) 1000 MCG tablet PLACE 1,000 MCG UNDER THE TONGUE 3 (THREE) TIMES A WEEK. 30 tablet 11   latanoprost (XALATAN) 0.005 % ophthalmic solution Place 1 drop into both eyes at bedtime.      losartan (COZAAR) 25 MG tablet Take 1 tablet (25 mg total) by mouth daily. 90 tablet 3   meloxicam (MOBIC) 7.5 MG tablet TAKE 1 TABLET (7.5 MG TOTAL) BY MOUTH DAILY. USE AS NEEDED FOR PAIN 30 tablet 1   rosuvastatin (CRESTOR) 10 MG tablet Take 1 tablet (10 mg total) by mouth daily. 90 tablet 0   Current Facility-Administered Medications on File Prior to Visit  Medication Dose Route Frequency Provider Last Rate Last Admin   0.9 %  sodium chloride infusion  500 mL Intravenous Once Rachael Fee, MD        Review of Systems:  As per HPI- otherwise negative.   Physical Examination: Vitals:   03/27/23 0839 03/27/23 0859  BP: (!) 162/80 (!) 160/90  Pulse: 73   Resp: 18   Temp: 98.4 F (36.9 C)   SpO2: 96%    Vitals:   03/27/23 0839  Weight: 199 lb 9.6 oz (90.5 kg)  Height: 5\' 6"  (1.676 m)   Body mass index is 32.22 kg/m. Ideal Body Weight: Weight in (lb) to have BMI = 25: 154.6  GEN: no acute distress. Looks well and younger than age   HEENT: Atraumatic, Normocephalic.  Ears and Nose: No external deformity. CV: RRR, No M/G/R. No JVD. No thrill. No extra heart sounds. PULM: CTA B, no wheezes, crackles, rhonchi. No retractions. No resp. distress. No accessory muscle use. ABD: S, NT, ND, +BS. No rebound. No HSM. EXTR: No c/c/e PSYCH: Normally interactive. Conversant.    Assessment and Plan: Dyslipidemia - Plan: Lipid panel  MGUS (monoclonal gammopathy of unknown significance)  Essential hypertension - Plan: Basic metabolic panel  Hypocalcemia -  Plan: Basic metabolic panel  Patient seen today for follow-up.  As per usual, her blood pressure is high in the clinic.  However she monitors this regularly at home and it is typically normal  Follow-up on her lipids today  Her MGUS is managed as above by hematology-Dr. Candise Che noted minimally low calcium at last visit, follow-up today Signed Abbe Amsterdam, MD  Received labs as below, message to patient  Results for orders placed or performed in visit on 03/27/23  Lipid panel  Result Value Ref Range   Cholesterol 149 0 - 200 mg/dL   Triglycerides 96.0 0.0 - 149.0 mg/dL   HDL 45.40 >98.11 mg/dL   VLDL 91.4 0.0 - 78.2 mg/dL   LDL Cholesterol 77 0 - 99 mg/dL   Total CHOL/HDL Ratio 3    NonHDL 90.08   Basic metabolic panel  Result Value Ref Range   Sodium 145 135 - 145 mEq/L   Potassium 4.2 3.5 - 5.1 mEq/L   Chloride 108 96 - 112 mEq/L   CO2 28 19 - 32 mEq/L   Glucose, Bld 83 70 - 99 mg/dL   BUN 14 6 - 23 mg/dL   Creatinine, Ser 9.56 0.40 - 1.20 mg/dL   GFR 21.30 (L) >86.57 mL/min   Calcium 9.0 8.4 - 10.5 mg/dL

## 2023-03-27 ENCOUNTER — Ambulatory Visit (INDEPENDENT_AMBULATORY_CARE_PROVIDER_SITE_OTHER): Payer: 59 | Admitting: Family Medicine

## 2023-03-27 ENCOUNTER — Encounter: Payer: Self-pay | Admitting: Family Medicine

## 2023-03-27 VITALS — BP 160/90 | HR 73 | Temp 98.4°F | Resp 18 | Ht 66.0 in | Wt 199.6 lb

## 2023-03-27 DIAGNOSIS — E785 Hyperlipidemia, unspecified: Secondary | ICD-10-CM | POA: Diagnosis not present

## 2023-03-27 DIAGNOSIS — I10 Essential (primary) hypertension: Secondary | ICD-10-CM | POA: Diagnosis not present

## 2023-03-27 DIAGNOSIS — D472 Monoclonal gammopathy: Secondary | ICD-10-CM

## 2023-03-27 DIAGNOSIS — R030 Elevated blood-pressure reading, without diagnosis of hypertension: Secondary | ICD-10-CM

## 2023-03-27 LAB — LIPID PANEL
Cholesterol: 149 mg/dL (ref 0–200)
HDL: 59.2 mg/dL (ref >39.00)
LDL Cholesterol: 77 mg/dL (ref 0–99)
NonHDL: 90.08
Total CHOL/HDL Ratio: 3
Triglycerides: 67 mg/dL (ref 0.0–149.0)
VLDL: 13.4 mg/dL (ref 0.0–40.0)

## 2023-03-27 LAB — BASIC METABOLIC PANEL
BUN: 14 mg/dL (ref 6–23)
CO2: 28 mEq/L (ref 19–32)
Calcium: 9 mg/dL (ref 8.4–10.5)
Chloride: 108 mEq/L (ref 96–112)
Creatinine, Ser: 0.91 mg/dL (ref 0.40–1.20)
GFR: 59.47 mL/min — ABNORMAL LOW (ref >60.00)
Glucose, Bld: 83 mg/dL (ref 70–99)
Potassium: 4.2 mEq/L (ref 3.5–5.1)
Sodium: 145 mEq/L (ref 135–145)

## 2023-05-18 ENCOUNTER — Other Ambulatory Visit: Payer: Self-pay | Admitting: Family Medicine

## 2023-05-18 ENCOUNTER — Encounter: Payer: Self-pay | Admitting: Family Medicine

## 2023-05-18 DIAGNOSIS — M25562 Pain in left knee: Secondary | ICD-10-CM

## 2023-05-18 DIAGNOSIS — M2550 Pain in unspecified joint: Secondary | ICD-10-CM

## 2023-05-18 DIAGNOSIS — N644 Mastodynia: Secondary | ICD-10-CM

## 2023-05-25 ENCOUNTER — Ambulatory Visit: Payer: 59 | Admitting: *Deleted

## 2023-05-25 DIAGNOSIS — Z0000 Encounter for general adult medical examination without abnormal findings: Secondary | ICD-10-CM | POA: Diagnosis not present

## 2023-05-25 NOTE — Progress Notes (Signed)
Subjective:   Tara Jimenez is a 82 y.o. female who presents for Medicare Annual (Subsequent) preventive examination.  Visit Complete: Virtual I connected with  Lavella Hammock on 05/25/23 by a audio enabled telemedicine application and verified that I am speaking with the correct person using two identifiers.  Patient Location: Home  Provider Location: Office/Clinic  I discussed the limitations of evaluation and management by telemedicine. The patient expressed understanding and agreed to proceed.  Vital Signs: Because this visit was a virtual/telehealth visit, some criteria may be missing or patient reported. Any vitals not documented were not able to be obtained and vitals that have been documented are patient reported.   Cardiac Risk Factors include: advanced age (>74men, >73 women);dyslipidemia;hypertension     Objective:    There were no vitals filed for this visit. There is no height or weight on file to calculate BMI.     05/25/2023    3:47 PM 05/17/2022    9:42 AM 12/08/2021    6:44 PM 05/12/2021    9:45 AM 07/22/2015    9:54 AM  Advanced Directives  Does Patient Have a Medical Advance Directive? No No No No No  Would patient like information on creating a medical advance directive? No - Patient declined No - Patient declined  Yes (MAU/Ambulatory/Procedural Areas - Information given) No - patient declined information    Current Medications (verified) Outpatient Encounter Medications as of 05/25/2023  Medication Sig   b complex vitamins capsule TAKE 1 CAPSULE BY MOUTH EVERY DAY   Cholecalciferol (VITAMIN D3) 1.25 MG (50000 UT) CAPS Take 1 weekly for 12 weeks   cyanocobalamin (VITAMIN B12) 1000 MCG tablet PLACE 1,000 MCG UNDER THE TONGUE 3 (THREE) TIMES A WEEK.   latanoprost (XALATAN) 0.005 % ophthalmic solution Place 1 drop into both eyes at bedtime.    losartan (COZAAR) 25 MG tablet Take 1 tablet (25 mg total) by mouth daily.   meloxicam (MOBIC) 7.5 MG tablet TAKE  1 TABLET (7.5 MG TOTAL) BY MOUTH DAILY. USE AS NEEDED FOR PAIN   rosuvastatin (CRESTOR) 10 MG tablet Take 1 tablet (10 mg total) by mouth daily.   No facility-administered encounter medications on file as of 05/25/2023.    Allergies (verified) Patient has no known allergies.   History: Past Medical History:  Diagnosis Date   Arthritis    Glaucoma    Hyperlipidemia    Past Surgical History:  Procedure Laterality Date   BRAIN SURGERY     COLONOSCOPY  2011   Family History  Problem Relation Age of Onset   Breast cancer Sister        4s   Colitis Sister 69   Diabetes Brother    Heart disease Brother    Hypertension Brother    Aneurysm Brother    Colon cancer Neg Hx    Esophageal cancer Neg Hx    Rectal cancer Neg Hx    Stomach cancer Neg Hx    Social History   Socioeconomic History   Marital status: Single    Spouse name: Not on file   Number of children: Not on file   Years of education: Not on file   Highest education level: Not on file  Occupational History   Not on file  Tobacco Use   Smoking status: Never   Smokeless tobacco: Never  Vaping Use   Vaping status: Never Used  Substance and Sexual Activity   Alcohol use: No    Alcohol/week: 0.0 standard drinks of alcohol  Drug use: No   Sexual activity: Not on file  Other Topics Concern   Not on file  Social History Narrative   Not on file   Social Determinants of Health   Financial Resource Strain: Low Risk  (05/25/2023)   Overall Financial Resource Strain (CARDIA)    Difficulty of Paying Living Expenses: Not very hard  Food Insecurity: No Food Insecurity (05/25/2023)   Hunger Vital Sign    Worried About Running Out of Food in the Last Year: Never true    Ran Out of Food in the Last Year: Never true  Transportation Needs: No Transportation Needs (05/25/2023)   PRAPARE - Administrator, Civil Service (Medical): No    Lack of Transportation (Non-Medical): No  Physical Activity:  Insufficiently Active (05/12/2021)   Exercise Vital Sign    Days of Exercise per Week: 7 days    Minutes of Exercise per Session: 10 min  Stress: No Stress Concern Present (05/25/2023)   [employer] of Occupational Health - Occupational Stress Questionnaire    Feeling of Stress : Not at all  Social Connections: Moderately Isolated (05/25/2023)   Social Connection and Isolation Panel [NHANES]    Frequency of Communication with Friends and Family: More than three times a week    Frequency of Social Gatherings with Friends and Family: Once a week    Attends Religious Services: More than 4 times per year    Active Member of [employer] or Organizations: No    Attends Banker Meetings: Never    Marital Status: Widowed    Tobacco Counseling Counseling given: Not Answered   Clinical Intake:  Pre-visit preparation completed: Yes  Pain : No/denies pain  Nutritional Risks: None Diabetes: No  How often do you need to have someone help you when you read instructions, pamphlets, or other written materials from your doctor or pharmacy?: 1 - Never  Interpreter Needed?: No  Information entered by :: Donne Anon, CMA   Activities of Daily Living    05/25/2023    3:43 PM  In your present state of health, do you have any difficulty performing the following activities:  Hearing? 0  Vision? 0  Difficulty concentrating or making decisions? 1  Comment "sometimes"  Walking or climbing stairs? 0  Dressing or bathing? 0  Doing errands, shopping? 0  Preparing Food and eating ? N  Using the Toilet? N  In the past six months, have you accidently leaked urine? N  Do you have problems with loss of bowel control? N  Managing your Medications? N  Managing your Finances? N  Housekeeping or managing your Housekeeping? N    Patient Care Team: Copland, Gwenlyn Found, MD as PCP - General (Family Medicine) Janet Berlin, MD as Consulting Physician (Ophthalmology)  Indicate any  recent Medical Services you may have received from other than [facility] providers in the past year (date may be approximate).     Assessment:   This is a routine wellness examination for [facility].  Hearing/Vision screen No results found.   Goals Addressed   None    Depression Screen    05/25/2023    3:51 PM 03/27/2023    8:45 AM 05/17/2022    9:42 AM 03/21/2022    8:58 AM 05/12/2021    9:47 AM 12/21/2020    8:52 AM 03/22/2017   10:16 AM  PHQ 2/9 Scores  PHQ - 2 Score 0 0 0 0 1 0 0  Exception Documentation  Patient refusal    Fall Risk    05/25/2023    3:47 PM 03/27/2023    8:45 AM 09/21/2022    8:56 AM 05/17/2022    9:42 AM 03/21/2022    8:58 AM  Fall Risk   Falls in the past year? 0 0 0 0 0  Number falls in past yr: 0 0 0 0 0  Injury with Fall? 0 0 0 0 0  Risk for fall due to : No Fall Risks No Fall Risks  No Fall Risks No Fall Risks  Follow up Falls evaluation completed Falls evaluation completed Falls evaluation completed Falls evaluation completed Falls evaluation completed    MEDICARE RISK AT HOME: Medicare Risk at Home Any stairs in or around the home?: No If so, are there any without handrails?: No Home free of loose throw rugs in walkways, pet beds, electrical cords, etc?: Yes Adequate lighting in your home to reduce risk of falls?: Yes Life alert?: No Use of a cane, walker or w/c?: No Grab bars in the bathroom?: No Shower chair or bench in shower?: Yes Elevated toilet seat or a handicapped toilet?: No  TIMED UP AND GO:  Was the test performed?  No    Cognitive Function:        05/25/2023    3:53 PM 05/17/2022    9:46 AM  6CIT Screen  What Year? 0 points 0 points  What month? 0 points 0 points  What time? 0 points 0 points  Count back from 20 0 points 0 points  Months in reverse 0 points 0 points  Repeat phrase 0 points 0 points  Total Score 0 points 0 points    Immunizations Immunization History  Administered Date(s) Administered   Fluad  Quad(high Dose 65+) 06/22/2020, 07/27/2021   Influenza Split 06/08/2016   Influenza, High Dose Seasonal PF 07/08/2017, 06/08/2018, 04/10/2022   Influenza,inj,Quad PF,6+ Mos 06/24/2015   Influenza-Unspecified 03/31/2023   PFIZER Comirnaty(Gray Top)Covid-19 Tri-Sucrose Vaccine 12/21/2020   PFIZER(Purple Top)SARS-COV-2 Vaccination 09/29/2019, 10/23/2019, 05/29/2020   Pfizer Covid-19 Vaccine Bivalent Booster 31yrs & up 07/27/2021   Pneumococcal Conjugate-13 11/10/2014   Pneumococcal Polysaccharide-23 09/18/2017   Td 11/10/2014   Unspecified SARS-COV-2 Vaccination 03/31/2023   Zoster Recombinant(Shingrix) 12/21/2020, 02/26/2021    TDAP status: Up to date  Flu Vaccine status: Up to date  Pneumococcal vaccine status: Up to date  Covid-19 vaccine status: Information provided on how to obtain vaccines.   Qualifies for Shingles Vaccine? Yes   Zostavax completed No   Shingrix Completed?: Yes  Screening Tests Health Maintenance  Topic Date Due   Medicare Annual Wellness (AWV)  05/18/2023   COVID-19 Vaccine (7 - 2023-24 season) 05/26/2023   DTaP/Tdap/Td (2 - Tdap) 11/09/2024   Pneumonia Vaccine 12+ Years old  Completed   INFLUENZA VACCINE  Completed   DEXA SCAN  Completed   Zoster Vaccines- Shingrix  Completed   HPV VACCINES  Aged Out   Hepatitis C Screening  Discontinued    Health Maintenance  Health Maintenance Due  Topic Date Due   Medicare Annual Wellness (AWV)  05/18/2023   COVID-19 Vaccine (7 - 2023-24 season) 05/26/2023    Colorectal cancer screening: No longer required.   Mammogram status: Completed 08/03/22. Repeat every year  Bone Density status: Pt declined  Lung Cancer Screening: (Low Dose CT Chest recommended if Age 17-80 years, 20 pack-year currently smoking OR have quit w/in 15years.) does not qualify.   Additional Screening:  Hepatitis C Screening: does not qualify  Vision Screening: Recommended annual ophthalmology exams for early detection of glaucoma  and other disorders of the eye. Is the patient up to date with their annual eye exam?  Yes  Who is the provider or what is the name of the office in which the patient attends annual eye exams? [facility] - Dr. Burgess Estelle If pt is not established with a provider, would they like to be referred to a provider to establish care? No .   Dental Screening: Recommended annual dental exams for proper oral hygiene  Diabetic Foot Exam: N/a  Community Resource Referral / Chronic Care Management: CRR required this visit?  No   CCM required this visit?  No     Plan:     I have personally reviewed and noted the following in the patient's chart:   Medical and social history Use of alcohol, tobacco or illicit drugs  Current medications and supplements including opioid prescriptions. Patient is not currently taking opioid prescriptions. Functional ability and status Nutritional status Physical activity Advanced directives List of other physicians Hospitalizations, surgeries, and ER visits in previous 12 months Vitals Screenings to include cognitive, depression, and falls Referrals and appointments  In addition, I have reviewed and discussed with patient certain preventive protocols, quality metrics, and best practice recommendations. A written personalized care plan for preventive services as well as general preventive health recommendations were provided to patient.     Donne Anon, CMA   05/25/2023   After Visit Summary: (MyChart) Due to this being a telephonic visit, the after visit summary with patients personalized plan was offered to patient via MyChart   Nurse Notes: None

## 2023-05-25 NOTE — Patient Instructions (Signed)
Tara Jimenez , Thank you for taking time to come for your Medicare Wellness Visit. I appreciate your ongoing commitment to your health goals. Please review the following plan we discussed and let me know if I can assist you in the future.     This is a list of the screening recommended for you and due dates:  Health Maintenance  Topic Date Due   COVID-19 Vaccine (7 - 2023-24 season) 05/26/2023   Medicare Annual Wellness Visit  05/24/2024   DTaP/Tdap/Td vaccine (2 - Tdap) 11/09/2024   Pneumonia Vaccine  Completed   Flu Shot  Completed   DEXA scan (bone density measurement)  Completed   Zoster (Shingles) Vaccine  Completed   HPV Vaccine  Aged Out   Hepatitis C Screening  Discontinued    Next appointment: Follow up in one year for your annual wellness visit    Preventive Care 65 Years and Older, Female Preventive care refers to lifestyle choices and visits with your health care provider that can promote health and wellness. What does preventive care include? A yearly physical exam. This is also called an annual well check. Dental exams once or twice a year. Routine eye exams. Ask your health care provider how often you should have your eyes checked. Personal lifestyle choices, including: Daily care of your teeth and gums. Regular physical activity. Eating a healthy diet. Avoiding tobacco and drug use. Limiting alcohol use. Practicing safe sex. Taking low-dose aspirin every day. Taking vitamin and mineral supplements as recommended by your health care provider. What happens during an annual well check? The services and screenings done by your health care provider during your annual well check will depend on your age, overall health, lifestyle risk factors, and family history of disease. Counseling  Your health care provider may ask you questions about your: Alcohol use. Tobacco use. Drug use. Emotional well-being. Home and relationship well-being. Sexual activity. Eating  habits. History of falls. Memory and ability to understand (cognition). Work and work Astronomer. Reproductive health. Screening  You may have the following tests or measurements: Height, weight, and BMI. Blood pressure. Lipid and cholesterol levels. These may be checked every 5 years, or more frequently if you are over 64 years old. Skin check. Lung cancer screening. You may have this screening every year starting at age 79 if you have a 30-pack-year history of smoking and currently smoke or have quit within the past 15 years. Fecal occult blood test (FOBT) of the stool. You may have this test every year starting at age 32. Flexible sigmoidoscopy or colonoscopy. You may have a sigmoidoscopy every 5 years or a colonoscopy every 10 years starting at age 38. Hepatitis C blood test. Hepatitis B blood test. Sexually transmitted disease (STD) testing. Diabetes screening. This is done by checking your blood sugar (glucose) after you have not eaten for a while (fasting). You may have this done every 1-3 years. Bone density scan. This is done to screen for osteoporosis. You may have this done starting at age 56. Mammogram. This may be done every 1-2 years. Talk to your health care provider about how often you should have regular mammograms. Talk with your health care provider about your test results, treatment options, and if necessary, the need for more tests. Vaccines  Your health care provider may recommend certain vaccines, such as: Influenza vaccine. This is recommended every year. Tetanus, diphtheria, and acellular pertussis (Tdap, Td) vaccine. You may need a Td booster every 10 years. Zoster vaccine. You may  need this after age 31. Pneumococcal 13-valent conjugate (PCV13) vaccine. One dose is recommended after age 58. Pneumococcal polysaccharide (PPSV23) vaccine. One dose is recommended after age 58. Talk to your health care provider about which screenings and vaccines you need and how  often you need them. This information is not intended to replace advice given to you by your health care provider. Make sure you discuss any questions you have with your health care provider. Document Released: 08/21/2015 Document Revised: 04/13/2016 Document Reviewed: 05/26/2015 Elsevier Interactive Patient Education  2017 [employer].  Fall Prevention in the Home Falls can cause injuries. They can happen to people of all ages. There are many things you can do to make your home safe and to help prevent falls. What can I do on the outside of my home? Regularly fix the edges of walkways and driveways and fix any cracks. Remove anything that might make you trip as you walk through a door, such as a raised step or threshold. Trim any bushes or trees on the path to your home. Use bright outdoor lighting. Clear any walking paths of anything that might make someone trip, such as rocks or tools. Regularly check to see if handrails are loose or broken. Make sure that both sides of any steps have handrails. Any raised decks and porches should have guardrails on the edges. Have any leaves, snow, or ice cleared regularly. Use sand or salt on walking paths during winter. Clean up any spills in your garage right away. This includes oil or grease spills. What can I do in the bathroom? Use night lights. Install grab bars by the toilet and in the tub and shower. Do not use towel bars as grab bars. Use non-skid mats or decals in the tub or shower. If you need to sit down in the shower, use a plastic, non-slip stool. Keep the floor dry. Clean up any water that spills on the floor as soon as it happens. Remove soap buildup in the tub or shower regularly. Attach bath mats securely with double-sided non-slip rug tape. Do not have throw rugs and other things on the floor that can make you trip. What can I do in the bedroom? Use night lights. Make sure that you have a light by your bed that is easy to  reach. Do not use any sheets or blankets that are too big for your bed. They should not hang down onto the floor. Have a firm chair that has side arms. You can use this for support while you get dressed. Do not have throw rugs and other things on the floor that can make you trip. What can I do in the kitchen? Clean up any spills right away. Avoid walking on wet floors. Keep items that you use a lot in easy-to-reach places. If you need to reach something above you, use a strong step stool that has a grab bar. Keep electrical cords out of the way. Do not use floor polish or wax that makes floors slippery. If you must use wax, use non-skid floor wax. Do not have throw rugs and other things on the floor that can make you trip. What can I do with my stairs? Do not leave any items on the stairs. Make sure that there are handrails on both sides of the stairs and use them. Fix handrails that are broken or loose. Make sure that handrails are as long as the stairways. Check any carpeting to make sure that it is firmly attached  to the stairs. Fix any carpet that is loose or worn. Avoid having throw rugs at the top or bottom of the stairs. If you do have throw rugs, attach them to the floor with carpet tape. Make sure that you have a light switch at the top of the stairs and the bottom of the stairs. If you do not have them, ask someone to add them for you. What else can I do to help prevent falls? Wear shoes that: Do not have high heels. Have rubber bottoms. Are comfortable and fit you well. Are closed at the toe. Do not wear sandals. If you use a stepladder: Make sure that it is fully opened. Do not climb a closed stepladder. Make sure that both sides of the stepladder are locked into place. Ask someone to hold it for you, if possible. Clearly mark and make sure that you can see: Any grab bars or handrails. First and last steps. Where the edge of each step is. Use tools that help you move  around (mobility aids) if they are needed. These include: Canes. Walkers. Scooters. Crutches. Turn on the lights when you go into a dark area. Replace any light bulbs as soon as they burn out. Set up your furniture so you have a clear path. Avoid moving your furniture around. If any of your floors are uneven, fix them. If there are any pets around you, be aware of where they are. Review your medicines with your doctor. Some medicines can make you feel dizzy. This can increase your chance of falling. Ask your doctor what other things that you can do to help prevent falls. This information is not intended to replace advice given to you by your health care provider. Make sure you discuss any questions you have with your health care provider. Document Released: 05/21/2009 Document Revised: 12/31/2015 Document Reviewed: 08/29/2014 Elsevier Interactive Patient Education  2017 [employer].

## 2023-05-28 ENCOUNTER — Other Ambulatory Visit: Payer: Self-pay | Admitting: Family Medicine

## 2023-05-28 DIAGNOSIS — E785 Hyperlipidemia, unspecified: Secondary | ICD-10-CM

## 2023-07-03 ENCOUNTER — Other Ambulatory Visit: Payer: Self-pay | Admitting: Family Medicine

## 2023-07-03 DIAGNOSIS — Z1231 Encounter for screening mammogram for malignant neoplasm of breast: Secondary | ICD-10-CM

## 2023-07-19 ENCOUNTER — Other Ambulatory Visit: Payer: Self-pay | Admitting: Family Medicine

## 2023-07-19 DIAGNOSIS — N644 Mastodynia: Secondary | ICD-10-CM

## 2023-07-19 DIAGNOSIS — M2550 Pain in unspecified joint: Secondary | ICD-10-CM

## 2023-07-19 DIAGNOSIS — M25562 Pain in left knee: Secondary | ICD-10-CM

## 2023-08-08 ENCOUNTER — Ambulatory Visit
Admission: RE | Admit: 2023-08-08 | Discharge: 2023-08-08 | Disposition: A | Discharge status: Home / Self Care | Payer: 59 | Source: Ambulatory Visit | Attending: Family Medicine | Admitting: Family Medicine

## 2023-08-08 DIAGNOSIS — Z1231 Encounter for screening mammogram for malignant neoplasm of breast: Secondary | ICD-10-CM | POA: Diagnosis not present

## 2023-09-05 ENCOUNTER — Other Ambulatory Visit: Payer: Self-pay | Admitting: Family Medicine

## 2023-09-05 DIAGNOSIS — E785 Hyperlipidemia, unspecified: Secondary | ICD-10-CM

## 2023-09-07 ENCOUNTER — Other Ambulatory Visit: Payer: Self-pay | Admitting: Family Medicine

## 2023-09-07 DIAGNOSIS — I10 Essential (primary) hypertension: Secondary | ICD-10-CM

## 2023-09-16 ENCOUNTER — Other Ambulatory Visit: Payer: Self-pay | Admitting: Family Medicine

## 2023-09-16 DIAGNOSIS — M2550 Pain in unspecified joint: Secondary | ICD-10-CM

## 2023-09-16 DIAGNOSIS — M25562 Pain in left knee: Secondary | ICD-10-CM

## 2023-09-16 DIAGNOSIS — N644 Mastodynia: Secondary | ICD-10-CM

## 2023-09-24 NOTE — Patient Instructions (Incomplete)
Good to see you again today Recommend a dose of RSV vaccine at your pharmacy and covid booster if needed

## 2023-09-24 NOTE — Progress Notes (Deleted)
 [facility] [address], [address] [place], [place] [address] [phone]  Date:  09/27/2023   Name:  Tara Jimenez   DOB:  1940/08/12   MRN:  [id number]  PCP:  Pearline Cables, MD    Chief Complaint: No chief complaint on file.   History of Present Illness:  Tara Jimenez is a 83 y.o. very pleasant female patient who presents with the following:  Pt seen today for a 6 month recheck Last seen by myself in August  -history of MGUS, subdural hematoma in 2011, arthritis and GERD, glaucoma managed by optho Dr Burgess Estelle, dyslipidemia, PAD  Chronic right foot drop since birth  Her BP tends to be ok when she checks it at home, elevated at office  Flu UTD Recommend RSV Shingrix done   Labs August- BMP, lipids  Patient Active Problem List   Diagnosis Date Noted   Peripheral arterial disease (HCC) 03/21/2022   Dyslipidemia 12/13/2019   Right foot drop 08/03/2016   Borderline blood pressure 08/03/2016   MGUS (monoclonal gammopathy of unknown significance) 08/17/2015   Leukopenia 07/22/2015   Paresthesias 07/22/2015   Bone pain 07/22/2015   Microscopic hematuria 02/06/2015   Subdural hematoma (HCC) 02/19/2010   OBESITY 02/15/2010   GERD 02/15/2010   ARTHRITIS 02/15/2010   UTI'S, HX OF 02/15/2010    Past Medical History:  Diagnosis Date   Arthritis    Glaucoma    Hyperlipidemia     Past Surgical History:  Procedure Laterality Date   BRAIN SURGERY     COLONOSCOPY  2011    Social History   Tobacco Use   Smoking status: Never   Smokeless tobacco: Never  Vaping Use   Vaping status: Never Used  Substance Use Topics   Alcohol use: No    Alcohol/week: 0.0 standard drinks of alcohol   Drug use: No    Family History  Problem Relation Age of Onset   Breast cancer Sister        64s   Colitis Sister 65   Diabetes Brother    Heart disease Brother    Hypertension Brother    Aneurysm Brother    Colon cancer  Neg Hx    Esophageal cancer Neg Hx    Rectal cancer Neg Hx    Stomach cancer Neg Hx     No Known Allergies  Medication list has been reviewed and updated.  Current Outpatient Medications on File Prior to Visit  Medication Sig Dispense Refill   b complex vitamins capsule TAKE 1 CAPSULE BY MOUTH EVERY DAY 100 capsule 3   Cholecalciferol (VITAMIN D3) 1.25 MG (50000 UT) CAPS Take 1 weekly for 12 weeks 12 capsule 0   cyanocobalamin (VITAMIN B12) 1000 MCG tablet PLACE 1,000 MCG UNDER THE TONGUE 3 (THREE) TIMES A WEEK. 30 tablet 11   latanoprost (XALATAN) 0.005 % ophthalmic solution Place 1 drop into both eyes at bedtime.      losartan (COZAAR) 25 MG tablet TAKE 1 TABLET (25 MG TOTAL) BY MOUTH DAILY. 90 tablet 3   meloxicam (MOBIC) 7.5 MG tablet TAKE 1 TABLET (7.5 MG TOTAL) BY MOUTH DAILY. USE AS NEEDED FOR PAIN 30 tablet 3   rosuvastatin (CRESTOR) 10 MG tablet Take 1 tablet (10 mg total) by mouth daily. 90 tablet 1   No current facility-administered medications on file prior to visit.    Review of Systems:  As per HPI- otherwise negative.   Physical Examination:  There were no vitals filed for this visit. There were no vitals filed for this visit. There is no height or weight on file to calculate BMI. Ideal Body Weight:    GEN: no acute distress. HEENT: Atraumatic, Normocephalic.  Ears and Nose: No external deformity. CV: RRR, No M/G/R. No JVD. No thrill. No extra heart sounds. PULM: CTA B, no wheezes, crackles, rhonchi. No retractions. No resp. distress. No accessory muscle use. ABD: S, NT, ND, +BS. No rebound. No HSM. EXTR: No c/c/e PSYCH: Normally interactive. Conversant.    Assessment and Plan: ***  Signed Abbe Amsterdam, MD

## 2023-09-27 ENCOUNTER — Ambulatory Visit: Payer: 59 | Admitting: Family Medicine

## 2023-09-27 DIAGNOSIS — E559 Vitamin D deficiency, unspecified: Secondary | ICD-10-CM

## 2023-09-27 DIAGNOSIS — I10 Essential (primary) hypertension: Secondary | ICD-10-CM

## 2023-09-27 DIAGNOSIS — D472 Monoclonal gammopathy: Secondary | ICD-10-CM

## 2023-09-27 DIAGNOSIS — Z131 Encounter for screening for diabetes mellitus: Secondary | ICD-10-CM

## 2023-09-27 DIAGNOSIS — R030 Elevated blood-pressure reading, without diagnosis of hypertension: Secondary | ICD-10-CM

## 2023-09-27 DIAGNOSIS — E785 Hyperlipidemia, unspecified: Secondary | ICD-10-CM

## 2023-09-27 DIAGNOSIS — Z1329 Encounter for screening for other suspected endocrine disorder: Secondary | ICD-10-CM

## 2023-09-29 NOTE — Patient Instructions (Addendum)
 It was great to see you again today, I will be in touch with your lab work asap Please stop by the pharmacy downstairs to get a dose of RSV  Let's increase your losartan to 50 mg daily to bring down your BP a touch.  Let me know if you home BP is running lower than 115/75 or if you are feeling dizzy or lightheaded  Assuming all is well please see me in 6 months

## 2023-09-29 NOTE — Progress Notes (Signed)
 [facility] at [facility] [address], [address] [place], [place] [address] [phone]  Date:  10/02/2023   Name:  Tara Jimenez   DOB:  [birth year]   MRN:  [id number]  PCP:  Pearline Cables, MD    Chief Complaint: Follow-up (No concerns )   History of Present Illness:  Tara Jimenez is a 83 y.o. very pleasant female patient who presents with the following:  Pt seen today for a 6 month recheck Last seen by myself in August  -history of MGUS, subdural hematoma in 2011, arthritis and GERD, glaucoma managed by optho Dr Burgess Estelle, dyslipidemia, PAD  Chronic right foot drop since birth  Her BP tends to be ok when she checks it at home, elevated at office  Flu UTD Recommend RSV Shingrix done  Mammo UTD Declines bone density today  Colon 2021- she is done with these   Labs August- BMP, lipids  Losartan 25 Meloxicam as needed- pt notes she uses once in a while  Crestor  Pt notes her home BP running 140s/80s  BP Readings from Last 3 Encounters:  10/02/23 (!) 144/84  03/27/23 (!) 160/90  09/28/22 (!) 154/69    She does tend to get more exercise in the warmer months and plans to do more walking  She has 2 daughters who are local, 3 grands, 3 great grands    Patient Active Problem List   Diagnosis Date Noted   Peripheral arterial disease (HCC) 03/21/2022   Dyslipidemia 12/13/2019   Right foot drop 08/03/2016   Borderline blood pressure 08/03/2016   MGUS (monoclonal gammopathy of unknown significance) 08/17/2015   Leukopenia 07/22/2015   Paresthesias 07/22/2015   Bone pain 07/22/2015   Microscopic hematuria 02/06/2015   Subdural hematoma (HCC) 02/19/2010   OBESITY 02/15/2010   GERD 02/15/2010   ARTHRITIS 02/15/2010   UTI'S, HX OF 02/15/2010    Past Medical History:  Diagnosis Date   Arthritis    Glaucoma    Hyperlipidemia     Past Surgical History:  Procedure Laterality Date   BRAIN SURGERY     COLONOSCOPY  2011     Social History   Tobacco Use   Smoking status: Never   Smokeless tobacco: Never  Vaping Use   Vaping status: Never Used  Substance Use Topics   Alcohol use: No    Alcohol/week: 0.0 standard drinks of alcohol   Drug use: No    Family History  Problem Relation Age of Onset   Breast cancer Sister        49s   Colitis Sister 98   Diabetes Brother    Heart disease Brother    Hypertension Brother    Aneurysm Brother    Colon cancer Neg Hx    Esophageal cancer Neg Hx    Rectal cancer Neg Hx    Stomach cancer Neg Hx     No Known Allergies  Medication list has been reviewed and updated.  Current Outpatient Medications on File Prior to Visit  Medication Sig Dispense Refill   b complex vitamins capsule TAKE 1 CAPSULE BY MOUTH EVERY DAY 100 capsule 3   Cholecalciferol (VITAMIN D3) 1.25 MG (50000 UT) CAPS Take 1 weekly for 12 weeks 12 capsule 0   cyanocobalamin (VITAMIN B12) 1000 MCG tablet PLACE 1,000 MCG UNDER THE TONGUE 3 (THREE) TIMES A WEEK. 30 tablet 11   latanoprost (XALATAN) 0.005 % ophthalmic solution Place 1 drop into both eyes at bedtime.  meloxicam (MOBIC) 7.5 MG tablet TAKE 1 TABLET (7.5 MG TOTAL) BY MOUTH DAILY. USE AS NEEDED FOR PAIN 30 tablet 3   rosuvastatin (CRESTOR) 10 MG tablet Take 1 tablet (10 mg total) by mouth daily. 90 tablet 1   No current facility-administered medications on file prior to visit.    Review of Systems:  As per HPI- otherwise negative.   Physical Examination: Vitals:   10/02/23 1001  BP: (!) 144/84  Pulse: 71  SpO2: 97%   Vitals:   10/02/23 1001  Weight: 201 lb 12.8 oz (91.5 kg)  Height: 5\' 6"  (1.676 m)   Body mass index is 32.57 kg/m. Ideal Body Weight: Weight in (lb) to have BMI = 25: 154.6  GEN: no acute distress.  Obese, looks great for age HEENT: Atraumatic, Normocephalic.  Bilateral TM wnl, oropharynx normal.  PEERL,EOMI.   Ears and Nose: No external deformity. CV: RRR, No M/G/R. No JVD. No thrill. No  extra heart sounds. PULM: CTA B, no wheezes, crackles, rhonchi. No retractions. No resp. distress. No accessory muscle use. ABD: S, NT, ND. No rebound. No HSM. EXTR: No c/c/e PSYCH: Normally interactive. Conversant.    Assessment and Plan: Vitamin B12 deficiency - Plan: Vitamin B12  Vitamin D deficiency - Plan: VITAMIN D 25 Hydroxy (Vit-D Deficiency, Fractures)  MGUS (monoclonal gammopathy of unknown significance)  Dyslipidemia  Essential hypertension - Plan: losartan (COZAAR) 50 MG tablet, CBC, Comprehensive metabolic panel  Screening for diabetes mellitus - Plan: Hemoglobin A1c  Following up today BP a bit elevated on last several readings- will up losartan to 50  Labs pending as above Recommend RSV   Signed Abbe Amsterdam, MD  Addendum 2/25, received labs as below.  Message to patient  Results for orders placed or performed in visit on 10/02/23  VITAMIN D 25 Hydroxy (Vit-D Deficiency, Fractures)   Collection Time: 10/02/23 10:21 AM  Result Value Ref Range   VITD 44.52 30.00 - 100.00 ng/mL  Vitamin B12   Collection Time: 10/02/23 10:21 AM  Result Value Ref Range   Vitamin B-12 534 211 - 911 pg/mL  Hemoglobin A1c   Collection Time: 10/02/23 10:21 AM  Result Value Ref Range   Hgb A1c MFr Bld 5.6 4.6 - 6.5 %  CBC   Collection Time: 10/02/23 10:21 AM  Result Value Ref Range   WBC 3.8 (L) 4.0 - 10.5 K/uL   RBC 4.07 3.87 - 5.11 Mil/uL   Platelets 183.0 150.0 - 400.0 K/uL   Hemoglobin 12.3 12.0 - 15.0 g/dL   HCT 69.6 29.5 - 28.4 %   MCV 93.6 78.0 - 100.0 fl   MCHC 32.3 30.0 - 36.0 g/dL   RDW 13.2 44.0 - 10.2 %  Comprehensive metabolic panel   Collection Time: 10/02/23 10:21 AM  Result Value Ref Range   Sodium 143 135 - 145 mEq/L   Potassium 4.8 3.5 - 5.1 mEq/L   Chloride 107 96 - 112 mEq/L   CO2 25 19 - 32 mEq/L   Glucose, Bld 80 70 - 99 mg/dL   BUN 13 6 - 23 mg/dL   Creatinine, Ser 7.25 0.40 - 1.20 mg/dL   Total Bilirubin 0.5 0.2 - 1.2 mg/dL   Alkaline  Phosphatase 41 39 - 117 U/L   AST 17 0 - 37 U/L   ALT 15 0 - 35 U/L   Total Protein 7.2 6.0 - 8.3 g/dL   Albumin 4.0 3.5 - 5.2 g/dL   GFR 36.64 >40.34 mL/min   Calcium  9.1 8.4 - 10.5 mg/dL

## 2023-10-02 ENCOUNTER — Encounter: Payer: Self-pay | Admitting: Family Medicine

## 2023-10-02 ENCOUNTER — Other Ambulatory Visit (HOSPITAL_BASED_OUTPATIENT_CLINIC_OR_DEPARTMENT_OTHER): Payer: Self-pay

## 2023-10-02 ENCOUNTER — Ambulatory Visit (INDEPENDENT_AMBULATORY_CARE_PROVIDER_SITE_OTHER): Payer: 59 | Admitting: Family Medicine

## 2023-10-02 VITALS — BP 145/90 | HR 71 | Ht 66.0 in | Wt 201.8 lb

## 2023-10-02 DIAGNOSIS — E559 Vitamin D deficiency, unspecified: Secondary | ICD-10-CM | POA: Diagnosis not present

## 2023-10-02 DIAGNOSIS — E785 Hyperlipidemia, unspecified: Secondary | ICD-10-CM

## 2023-10-02 DIAGNOSIS — E538 Deficiency of other specified B group vitamins: Secondary | ICD-10-CM | POA: Diagnosis not present

## 2023-10-02 DIAGNOSIS — Z131 Encounter for screening for diabetes mellitus: Secondary | ICD-10-CM

## 2023-10-02 DIAGNOSIS — D472 Monoclonal gammopathy: Secondary | ICD-10-CM

## 2023-10-02 DIAGNOSIS — I10 Essential (primary) hypertension: Secondary | ICD-10-CM | POA: Diagnosis not present

## 2023-10-02 LAB — VITAMIN B12: Vitamin B-12: 534 pg/mL (ref 211–911)

## 2023-10-02 LAB — HEMOGLOBIN A1C: Hgb A1c MFr Bld: 5.6 % (ref 4.6–6.5)

## 2023-10-02 LAB — VITAMIN D 25 HYDROXY (VIT D DEFICIENCY, FRACTURES): VITD: 44.52 ng/mL (ref 30.00–100.00)

## 2023-10-02 MED ORDER — LOSARTAN POTASSIUM 50 MG PO TABS: 50.0000 mg | ORAL_TABLET | Freq: Every day | ORAL | 3 refills | Status: DC

## 2023-10-02 MED ORDER — RSVPREF3 VAC RECOMB ADJUVANTED 120 MCG/0.5ML IM SUSR
0.5000 mL | Freq: Once | INTRAMUSCULAR | 0 refills | Status: AC
  Filled 2023-10-02: qty 0.5, 1d supply, fill #0

## 2023-10-03 ENCOUNTER — Encounter: Payer: Self-pay | Admitting: Family Medicine

## 2023-10-03 LAB — COMPREHENSIVE METABOLIC PANEL
ALT: 15 U/L (ref 0–35)
AST: 17 U/L (ref 0–37)
Albumin: 4 g/dL (ref 3.5–5.2)
Alkaline Phosphatase: 41 U/L (ref 39–117)
BUN: 13 mg/dL (ref 6–23)
CO2: 25 meq/L (ref 19–32)
Calcium: 9.1 mg/dL (ref 8.4–10.5)
Chloride: 107 meq/L (ref 96–112)
Creatinine, Ser: 0.9 mg/dL (ref 0.40–1.20)
GFR: 60.05 mL/min (ref >60.00)
Glucose, Bld: 80 mg/dL (ref 70–99)
Potassium: 4.8 meq/L (ref 3.5–5.1)
Sodium: 143 meq/L (ref 135–145)
Total Bilirubin: 0.5 mg/dL (ref 0.2–1.2)
Total Protein: 7.2 g/dL (ref 6.0–8.3)

## 2023-10-03 LAB — CBC
HCT: 38 % (ref 36.0–46.0)
Hemoglobin: 12.3 g/dL (ref 12.0–15.0)
MCHC: 32.3 g/dL (ref 30.0–36.0)
MCV: 93.6 fl (ref 78.0–100.0)
Platelets: 183 10*3/uL (ref 150.0–400.0)
RBC: 4.07 Mil/uL (ref 3.87–5.11)
RDW: 13.6 % (ref 11.5–15.5)
WBC: 3.8 10*3/uL — ABNORMAL LOW (ref 4.0–10.5)

## 2023-10-04 DIAGNOSIS — H2513 Age-related nuclear cataract, bilateral: Secondary | ICD-10-CM | POA: Diagnosis not present

## 2023-10-04 DIAGNOSIS — H401132 Primary open-angle glaucoma, bilateral, moderate stage: Secondary | ICD-10-CM | POA: Diagnosis not present

## 2023-11-02 ENCOUNTER — Telehealth: Payer: Self-pay | Admitting: Neurology

## 2023-11-02 NOTE — Telephone Encounter (Signed)
 Pt is already scheduled for a npt appt on 11/29/23.

## 2023-11-02 NOTE — Telephone Encounter (Signed)
 Copied from CRM [id number]. Topic: General - Other >> Nov 02, 2023 11:54 AM Arley Phenix D wrote: Reason for CRM: Patient stated that her daughter, Joanne Gavel is trying to become a patient with Dr.Copland. Patient stated that whenever her daughter calls she is told that Dr.Copland isn't accepting new patients. Patient stated that Dr.Copland informed her to tell the clinic that its her daughter calling and to set up an appointment with Dr.Copland for her daughter Jill Side.

## 2023-11-07 ENCOUNTER — Other Ambulatory Visit: Payer: Self-pay | Admitting: Family Medicine

## 2023-11-07 DIAGNOSIS — I10 Essential (primary) hypertension: Secondary | ICD-10-CM

## 2023-11-07 MED ORDER — LOSARTAN POTASSIUM 50 MG PO TABS: 50.0000 mg | ORAL_TABLET | Freq: Every day | ORAL | 1 refills | Status: DC

## 2023-11-07 NOTE — Telephone Encounter (Signed)
 Copied from CRM [id number]. Topic: Clinical - Medication Refill >> Nov 07, 2023 12:18 PM [place] J wrote: Most Recent Primary Care Visit:  Provider: Pearline Cables  Department: [facility]  Visit Type: OFFICE VISIT  Date: 10/02/2023  Medication: losartan (COZAAR) 50 MG tablet  Has the patient contacted their pharmacy? No (Agent: If no, request that the patient contact the pharmacy for the refill. If patient does not wish to contact the pharmacy document the reason why and proceed with request.) (Agent: If yes, when and what did the pharmacy advise?) Patient and provider recently spoke regarding upping dosage on this medication. It was previously 25mg  and now it's 50mg . Patient wanted to directly contact us to confirm that the 50mg  dose is being requested.   Is this the correct pharmacy for this prescription? Yes If no, delete pharmacy and type the correct one.  This is the patient's preferred pharmacy:  [facility] [address], [place] - [address] [place] [address] Phone: [phone] Fax: [phone]   Has the prescription been filled recently? No  Is the patient out of the medication? No  Has the patient been seen for an appointment in the last year OR does the patient have an upcoming appointment? Yes  Can we respond through MyChart? Yes  Agent: Please be advised that Rx refills may take up to 3 business days. We ask that you follow-up with your pharmacy.

## 2023-11-22 ENCOUNTER — Other Ambulatory Visit: Payer: Self-pay | Admitting: Family Medicine

## 2023-11-22 DIAGNOSIS — E785 Hyperlipidemia, unspecified: Secondary | ICD-10-CM

## 2024-01-11 DIAGNOSIS — H401132 Primary open-angle glaucoma, bilateral, moderate stage: Secondary | ICD-10-CM | POA: Diagnosis not present

## 2024-01-11 DIAGNOSIS — H2513 Age-related nuclear cataract, bilateral: Secondary | ICD-10-CM | POA: Diagnosis not present

## 2024-03-28 NOTE — Patient Instructions (Addendum)
 It was great to see you today, I will be in touch with your lab work Recommend flu shot and COVID booster this fall Assuming everything is going well, please see me in about 6 months  Please stop by imaging to set up your bone density

## 2024-03-28 NOTE — Progress Notes (Signed)
  Healthcare at [facility] [address] [place], [place] [address] [phone]  Date:  04/01/2024   Name:  Tara Jimenez   DOB:  February 16, 1941   MRN:  [id number]  PCP:  Watt Harlene BROCKS, MD    Chief Complaint: Follow-up   History of Present Illness:  Tara Jimenez is a 83 y.o. very pleasant female patient who presents with the following:  Patient seen today for periodic follow-up.  I last saw Tara Jimenez in February   -history of MGUS, subdural hematoma in 2011, arthritis and GERD, glaucoma managed by optho Dr Patrcia, dyslipidemia, PAD. Chronic right foot drop since birth  Her BP tends to be ok when she checks it at home, elevated at office  She saw hematology- Dr Onesimo- 2/24.  He gave her PRN recheck at her last visit She does tend to get more exercise in the warmer months-mostly walking   She has 2 daughters who are local, 3 grands, 3 great grands   Recommend flu shot and COVID booster this fall Tetanus is due next year She is up-to-date on shingles vaccine, pneumonia, RSV Mammogram is up-to-date DEXA scan can be updated Colonoscopy 2021-no repeat needed Lab work completed in [employer], vitamin D , B12, CBC which showed minimally low white cell count, A1c 5.6%  Vitamin D  supplement, B12 supplement Losartan  50 Crestor  Meloxicam   150/80 recheck   Discussed the use of AI scribe software for clinical note transcription with the patient, who gave verbal consent to proceed.  History of Present Illness Tara Jimenez is an 83 year old female who presents for a routine follow-up visit.  She experiences intermittent joint pain due to arthritis, managed with meloxicam  and Tylenol Arthritis as needed. The pain sometimes disrupts her sleep.  Her blood pressure readings at home are generally around 130/70 mmHg. She takes losartan  50 mg daily for hypertension.  She is currently taking B12 supplements and Crestor  for cholesterol  management, but not vitamin D .  She has a history of glaucoma.  Her daughter recently broke her foot.  No chest pain or difficulty breathing during activities such as walking, grocery shopping, or climbing steps.     Patient Active Problem List   Diagnosis Date Noted   Peripheral arterial disease (HCC) 03/21/2022   Dyslipidemia 12/13/2019   Right foot drop 08/03/2016   Borderline blood pressure 08/03/2016   MGUS (monoclonal gammopathy of unknown significance) 08/17/2015   Leukopenia 07/22/2015   Paresthesias 07/22/2015   Bone pain 07/22/2015   Microscopic hematuria 02/06/2015   Subdural hematoma (HCC) 02/19/2010   OBESITY 02/15/2010   GERD 02/15/2010   ARTHRITIS 02/15/2010   UTI'S, HX OF 02/15/2010    Past Medical History:  Diagnosis Date   Arthritis    Glaucoma    Hyperlipidemia     Past Surgical History:  Procedure Laterality Date   BRAIN SURGERY     COLONOSCOPY  2011    Social History   Tobacco Use   Smoking status: Never   Smokeless tobacco: Never  Vaping Use   Vaping status: Never Used  Substance Use Topics   Alcohol use: No    Alcohol/week: 0.0 standard drinks of alcohol   Drug use: No    Family History  Problem Relation Age of Onset   Breast cancer Sister        66s   Colitis Sister 71   Diabetes Brother    Heart disease Brother    Hypertension Brother  Aneurysm Brother    Colon cancer Neg Hx    Esophageal cancer Neg Hx    Rectal cancer Neg Hx    Stomach cancer Neg Hx     No Known Allergies  Medication list has been reviewed and updated.  Current Outpatient Medications on File Prior to Visit  Medication Sig Dispense Refill   b complex vitamins capsule TAKE 1 CAPSULE BY MOUTH EVERY DAY 100 capsule 3   Cholecalciferol (VITAMIN D3) 1.25 MG (50000 UT) CAPS Take 1 weekly for 12 weeks 12 capsule 0   cyanocobalamin  (VITAMIN B12) 1000 MCG tablet PLACE 1,000 MCG UNDER THE TONGUE 3 (THREE) TIMES A WEEK. 30 tablet 11   latanoprost  (XALATAN )  0.005 % ophthalmic solution Place 1 drop into both eyes at bedtime.      losartan  (COZAAR ) 50 MG tablet Take 1 tablet (50 mg total) by mouth daily. 90 tablet 1   meloxicam  (MOBIC ) 7.5 MG tablet TAKE 1 TABLET (7.5 MG TOTAL) BY MOUTH DAILY. USE AS NEEDED FOR PAIN 30 tablet 3   rosuvastatin  (CRESTOR ) 10 MG tablet TAKE 1 TABLET BY MOUTH EVERY DAY 90 tablet 1   No current facility-administered medications on file prior to visit.    Review of Systems:  As per HPI- otherwise negative.   Physical Examination: Vitals:   04/01/24 0851  BP: (!) 146/82  Pulse: 72  Temp: 98.8 F (37.1 C)  SpO2: 95%   Vitals:   04/01/24 0851  Weight: 203 lb 6.4 oz (92.3 kg)  Height: 5' 6 (1.676 m)   Body mass index is 32.83 kg/m. Ideal Body Weight: Weight in (lb) to have BMI = 25: 154.6  GEN: no acute distress.  Mild obesity, looks well  HEENT: Atraumatic, Normocephalic.  Ears and Nose: No external deformity. CV: RRR, No M/G/R. No JVD. No thrill. No extra heart sounds. PULM: CTA B, no wheezes, crackles, rhonchi. No retractions. No resp. distress. No accessory muscle use. ABD: S, NT, ND. No rebound. No HSM. EXTR: No c/c/e PSYCH: Normally interactive. Conversant.  Wearing brace on right foot for chronic foot drop   Assessment and Plan: Dyslipidemia - Plan: Lipid panel, rosuvastatin  (CRESTOR ) 10 MG tablet  MGUS (monoclonal gammopathy of unknown significance) - Plan: CBC  Essential hypertension - Plan: Basic metabolic panel with GFR, CBC, losartan  (COZAAR ) 50 MG tablet  Vitamin B12 deficiency  Screening for diabetes mellitus - Plan: Hemoglobin A1c  Vitamin D  deficiency  Thyroid  disorder screening - Plan: TSH  Estrogen deficiency - Plan: DG Bone Density Assessment and Plan Assessment & Plan Arthralgia Symptoms are intermittent and well-managed with current medication regimen. - Continue meloxicam  as needed. - Continue acetaminophen as needed.  Essential hypertension, controlled with  losartan  Blood pressure slightly elevated in office but well-controlled at home with losartan . - Continue losartan  50 mg daily. - Monitor blood pressure at home every other day. - Report any persistent higher readings at home.  Dyslipidemia, managed with rosuvastatin  No new concerns. - Continue rosuvastatin  as prescribed.  Monoclonal gammopathy, stable No new symptoms or changes reported. - Continue monitoring as per hematology recommendations. Sent back to Dr Onesimo if changing   Glaucoma, controlled Intraocular pressure stable with current treatment. - Continue current ophthalmic treatment regimen.  Foot drop, stable No changes reported.  Vitamin B12 deficiency, managed with supplementation No new concerns. - Continue sublingual vitamin B12 supplementation.  Vitamin D  deficiency, not currently supplemented Not taking vitamin D  as previously prescribed. - Discuss the importance of vitamin D  supplementation for bone  health.  General Health Maintenance Routine health maintenance up to date except for bone density screening. - Order bone density scan to assess for osteoporosis. - Recommend flu shot and COVID booster this fall. -remind mammo this December     Signed Harlene Schroeder, MD  Addendum 8/26, received labs as below.  Message to patient Results for orders placed or performed in visit on 04/01/24  Basic metabolic panel with GFR   Collection Time: 04/01/24  9:21 AM  Result Value Ref Range   Sodium 142 135 - 145 mEq/L   Potassium 4.3 3.5 - 5.1 mEq/L   Chloride 107 96 - 112 mEq/L   CO2 26 19 - 32 mEq/L   Glucose, Bld 89 70 - 99 mg/dL   BUN 12 6 - 23 mg/dL   Creatinine, Ser 9.11 0.40 - 1.20 mg/dL   GFR 38.52 >39.99 mL/min   Calcium  8.8 8.4 - 10.5 mg/dL  CBC   Collection Time: 04/01/24  9:21 AM  Result Value Ref Range   WBC 3.0 (L) 4.0 - 10.5 K/uL   RBC 3.85 (L) 3.87 - 5.11 Mil/uL   Platelets 169.0 150.0 - 400.0 K/uL   Hemoglobin 11.5 (L) 12.0 - 15.0 g/dL    HCT 64.7 (L) 63.9 - 46.0 %   MCV 91.3 78.0 - 100.0 fl   MCHC 32.6 30.0 - 36.0 g/dL   RDW 86.2 88.4 - 84.4 %  Hemoglobin A1c   Collection Time: 04/01/24  9:21 AM  Result Value Ref Range   Hgb A1c MFr Bld 5.8 4.6 - 6.5 %  Lipid panel   Collection Time: 04/01/24  9:21 AM  Result Value Ref Range   Cholesterol 129 0 - 200 mg/dL   Triglycerides 28.9 0.0 - 149.0 mg/dL   HDL 46.79 >60.99 mg/dL   VLDL 85.7 0.0 - 59.9 mg/dL   LDL Cholesterol 62 0 - 99 mg/dL   Total CHOL/HDL Ratio 2    NonHDL 75.90   TSH   Collection Time: 04/01/24  9:21 AM  Result Value Ref Range   TSH 1.60 0.35 - 5.50 uIU/mL

## 2024-04-01 ENCOUNTER — Ambulatory Visit (INDEPENDENT_AMBULATORY_CARE_PROVIDER_SITE_OTHER): Payer: 59 | Admitting: Family Medicine

## 2024-04-01 ENCOUNTER — Encounter: Payer: Self-pay | Admitting: Family Medicine

## 2024-04-01 VITALS — BP 150/80 | HR 72 | Temp 98.8°F | Ht 66.0 in | Wt 203.4 lb

## 2024-04-01 DIAGNOSIS — R7303 Prediabetes: Secondary | ICD-10-CM

## 2024-04-01 DIAGNOSIS — E538 Deficiency of other specified B group vitamins: Secondary | ICD-10-CM | POA: Diagnosis not present

## 2024-04-01 DIAGNOSIS — Z131 Encounter for screening for diabetes mellitus: Secondary | ICD-10-CM

## 2024-04-01 DIAGNOSIS — Z1329 Encounter for screening for other suspected endocrine disorder: Secondary | ICD-10-CM | POA: Diagnosis not present

## 2024-04-01 DIAGNOSIS — I10 Essential (primary) hypertension: Secondary | ICD-10-CM | POA: Diagnosis not present

## 2024-04-01 DIAGNOSIS — D472 Monoclonal gammopathy: Secondary | ICD-10-CM | POA: Diagnosis not present

## 2024-04-01 DIAGNOSIS — E785 Hyperlipidemia, unspecified: Secondary | ICD-10-CM

## 2024-04-01 DIAGNOSIS — E559 Vitamin D deficiency, unspecified: Secondary | ICD-10-CM

## 2024-04-01 DIAGNOSIS — E2839 Other primary ovarian failure: Secondary | ICD-10-CM

## 2024-04-01 LAB — CBC
HCT: 35.2 % — ABNORMAL LOW (ref 36.0–46.0)
Hemoglobin: 11.5 g/dL — ABNORMAL LOW (ref 12.0–15.0)
MCHC: 32.6 g/dL (ref 30.0–36.0)
MCV: 91.3 fl (ref 78.0–100.0)
Platelets: 169 K/uL (ref 150.0–400.0)
RBC: 3.85 Mil/uL — ABNORMAL LOW (ref 3.87–5.11)
RDW: 13.7 % (ref 11.5–15.5)
WBC: 3 K/uL — ABNORMAL LOW (ref 4.0–10.5)

## 2024-04-01 LAB — LIPID PANEL
Cholesterol: 129 mg/dL (ref 0–200)
HDL: 53.2 mg/dL (ref >39.00)
LDL Cholesterol: 62 mg/dL (ref 0–99)
NonHDL: 75.9
Total CHOL/HDL Ratio: 2
Triglycerides: 71 mg/dL (ref 0.0–149.0)
VLDL: 14.2 mg/dL (ref 0.0–40.0)

## 2024-04-01 LAB — BASIC METABOLIC PANEL WITH GFR
BUN: 12 mg/dL (ref 6–23)
CO2: 26 meq/L (ref 19–32)
Calcium: 8.8 mg/dL (ref 8.4–10.5)
Chloride: 107 meq/L (ref 96–112)
Creatinine, Ser: 0.88 mg/dL (ref 0.40–1.20)
GFR: 61.47 mL/min (ref >60.00)
Glucose, Bld: 89 mg/dL (ref 70–99)
Potassium: 4.3 meq/L (ref 3.5–5.1)
Sodium: 142 meq/L (ref 135–145)

## 2024-04-01 LAB — HEMOGLOBIN A1C: Hgb A1c MFr Bld: 5.8 % (ref 4.6–6.5)

## 2024-04-01 LAB — TSH: TSH: 1.6 u[IU]/mL (ref 0.35–5.50)

## 2024-04-01 MED ORDER — LOSARTAN POTASSIUM 50 MG PO TABS: 50.0000 mg | ORAL_TABLET | Freq: Every day | ORAL | 3 refills | Status: AC

## 2024-04-01 MED ORDER — ROSUVASTATIN CALCIUM 10 MG PO TABS: 10.0000 mg | ORAL_TABLET | Freq: Every day | ORAL | 3 refills | Status: AC

## 2024-04-02 ENCOUNTER — Encounter: Payer: Self-pay | Admitting: Family Medicine

## 2024-04-02 DIAGNOSIS — R7303 Prediabetes: Secondary | ICD-10-CM | POA: Insufficient documentation

## 2024-04-02 NOTE — Addendum Note (Signed)
 Addended by: WATT RAISIN C on: 04/02/2024 12:09 PM   Modules accepted: Orders

## 2024-04-23 ENCOUNTER — Telehealth: Payer: Self-pay

## 2024-04-23 NOTE — Telephone Encounter (Signed)
 Copied from CRM [id number]. Topic: Appointments - Appointment Info/Confirmation >> Apr 23, 2024 11:12 AM Rosina BIRCH wrote: Patient/patient representative is calling for information regarding an appointment.   Patient called stating she had an appointment for today with the annual wellness nurse at 11am but no one called her. I told the patient that I see an appointment in October for a virtual call. The patient stated someone called her and said that they wanted to make it for Monday but she had something else to do so she made it for today  [phone]

## 2024-04-24 NOTE — Telephone Encounter (Signed)
 Spoke with pt. I am unsure what happened but the only time I see is the existing appt for 05/28/24 at 11am.  Pt is agreeable to do the visit in October as scheduled.

## 2024-05-01 ENCOUNTER — Ambulatory Visit (HOSPITAL_BASED_OUTPATIENT_CLINIC_OR_DEPARTMENT_OTHER)
Admission: RE | Admit: 2024-05-01 | Discharge: 2024-05-01 | Disposition: A | Discharge status: Home / Self Care | Source: Ambulatory Visit | Attending: Family Medicine | Admitting: Family Medicine

## 2024-05-01 ENCOUNTER — Encounter: Payer: Self-pay | Admitting: Family Medicine

## 2024-05-01 DIAGNOSIS — E2839 Other primary ovarian failure: Secondary | ICD-10-CM | POA: Diagnosis present

## 2024-05-01 DIAGNOSIS — Z780 Asymptomatic menopausal state: Secondary | ICD-10-CM | POA: Diagnosis not present

## 2024-05-22 DIAGNOSIS — H5203 Hypermetropia, bilateral: Secondary | ICD-10-CM | POA: Diagnosis not present

## 2024-05-22 DIAGNOSIS — H2513 Age-related nuclear cataract, bilateral: Secondary | ICD-10-CM | POA: Diagnosis not present

## 2024-05-22 DIAGNOSIS — H524 Presbyopia: Secondary | ICD-10-CM | POA: Diagnosis not present

## 2024-05-22 DIAGNOSIS — H43813 Vitreous degeneration, bilateral: Secondary | ICD-10-CM | POA: Diagnosis not present

## 2024-05-22 DIAGNOSIS — H04123 Dry eye syndrome of bilateral lacrimal glands: Secondary | ICD-10-CM | POA: Diagnosis not present

## 2024-05-22 DIAGNOSIS — H401132 Primary open-angle glaucoma, bilateral, moderate stage: Secondary | ICD-10-CM | POA: Diagnosis not present

## 2024-05-22 DIAGNOSIS — H25013 Cortical age-related cataract, bilateral: Secondary | ICD-10-CM | POA: Diagnosis not present

## 2024-05-28 ENCOUNTER — Ambulatory Visit (INDEPENDENT_AMBULATORY_CARE_PROVIDER_SITE_OTHER): Admitting: *Deleted

## 2024-05-28 VITALS — BP 148/80 | Ht 66.0 in | Wt 203.0 lb

## 2024-05-28 DIAGNOSIS — Z0000 Encounter for general adult medical examination without abnormal findings: Secondary | ICD-10-CM

## 2024-05-28 NOTE — Progress Notes (Signed)
 Please attest this visit in the absence of patient primary care provider.    Subjective:   Tara Jimenez is a 83 y.o. who presents for a Medicare Wellness preventive visit.  As a reminder, Annual Wellness Visits don't include a physical exam, and some assessments may be limited, especially if this visit is performed virtually. We may recommend an in-person follow-up visit with your provider if needed.  Visit Complete: Virtual I connected with  Tara Jimenez on 05/28/24 by a audio enabled telemedicine application and verified that I am speaking with the correct person using two identifiers.  Patient Location: Home  Provider Location: Office/Clinic  I discussed the limitations of evaluation and management by telemedicine. The patient expressed understanding and agreed to proceed.  Vital Signs: Because this visit was a virtual/telehealth visit, some criteria may be missing or patient reported. Any vitals not documented were not able to be obtained and vitals that have been documented are patient reported.  VideoDeclined- This patient declined Librarian, academic. Therefore the visit was completed with audio only.  Persons Participating in Visit: Patient.  AWV Questionnaire: No: Patient Medicare AWV questionnaire was not completed prior to this visit.  Cardiac Risk Factors include: advanced age (>20men, >60 women);dyslipidemia;obesity (BMI >30kg/m2);Other (see comment), Risk factor comments: PAD     Objective:    Today's Vitals   05/28/24 1105  BP: (!) 148/80  Weight: 203 lb (92.1 kg)  Height: 5' 6 (1.676 m)   Body mass index is 32.77 kg/m.     05/28/2024   11:14 AM 05/25/2023    3:47 PM 05/17/2022    9:42 AM 12/08/2021    6:44 PM 05/12/2021    9:45 AM 07/22/2015    9:54 AM  Advanced Directives  Does Patient Have a Medical Advance Directive? No No No No No No   Would patient like information on creating a medical advance directive? No -  Patient declined No - Patient declined No - Patient declined  Yes (MAU/Ambulatory/Procedural Areas - Information given) No - patient declined information      Data saved with a previous flowsheet row definition    Current Medications (verified) Outpatient Encounter Medications as of 05/28/2024  Medication Sig   b complex vitamins capsule TAKE 1 CAPSULE BY MOUTH EVERY DAY   Cholecalciferol (VITAMIN D3) 1.25 MG (50000 UT) CAPS Take 1 weekly for 12 weeks   cyanocobalamin  (VITAMIN B12) 1000 MCG tablet PLACE 1,000 MCG UNDER THE TONGUE 3 (THREE) TIMES A WEEK.   latanoprost  (XALATAN ) 0.005 % ophthalmic solution Place 1 drop into both eyes at bedtime.    losartan  (COZAAR ) 50 MG tablet Take 1 tablet (50 mg total) by mouth daily.   meloxicam  (MOBIC ) 7.5 MG tablet TAKE 1 TABLET (7.5 MG TOTAL) BY MOUTH DAILY. USE AS NEEDED FOR PAIN   rosuvastatin  (CRESTOR ) 10 MG tablet Take 1 tablet (10 mg total) by mouth daily.   No facility-administered encounter medications on file as of 05/28/2024.    Allergies (verified) Patient has no known allergies.   History: Past Medical History:  Diagnosis Date   Arthritis    Glaucoma    Hyperlipidemia    Past Surgical History:  Procedure Laterality Date   BRAIN SURGERY     COLONOSCOPY  2011   Family History  Problem Relation Age of Onset   Breast cancer Sister        42s   Colitis Sister 62   Diabetes Brother    Heart disease Brother  Hypertension Brother    Aneurysm Brother    Colon cancer Neg Hx    Esophageal cancer Neg Hx    Rectal cancer Neg Hx    Stomach cancer Neg Hx    Social History   Socioeconomic History   Marital status: Single    Spouse name: Not on file   Number of children: Not on file   Years of education: Not on file   Highest education level: Not on file  Occupational History   Not on file  Tobacco Use   Smoking status: Never   Smokeless tobacco: Never  Vaping Use   Vaping status: Never Used  Substance and Sexual  Activity   Alcohol use: No    Alcohol/week: 0.0 standard drinks of alcohol   Drug use: No   Sexual activity: Not on file  Other Topics Concern   Not on file  Social History Narrative   Not on file   Social Drivers of Health   Financial Resource Strain: Low Risk  (05/28/2024)   Overall Financial Resource Strain (CARDIA)    Difficulty of Paying Living Expenses: Not very hard  Food Insecurity: No Food Insecurity (05/28/2024)   Hunger Vital Sign    Worried About Running Out of Food in the Last Year: Never true    Ran Out of Food in the Last Year: Never true  Transportation Needs: No Transportation Needs (05/28/2024)   PRAPARE - Administrator, Civil Service (Medical): No    Lack of Transportation (Non-Medical): No  Physical Activity: Insufficiently Active (05/28/2024)   Exercise Vital Sign    Days of Exercise per Week: 7 days    Minutes of Exercise per Session: 10 min  Stress: No Stress Concern Present (05/28/2024)   [employer] of Occupational Health - Occupational Stress Questionnaire    Feeling of Stress: Not at all  Social Connections: Moderately Isolated (05/28/2024)   Social Connection and Isolation Panel    Frequency of Communication with Friends and Family: More than three times a week    Frequency of Social Gatherings with Friends and Family: Once a week    Attends Religious Services: More than 4 times per year    Active Member of [employer] or Organizations: No    Attends Banker Meetings: Never    Marital Status: Widowed    Tobacco Counseling Counseling given: Not Answered    Clinical Intake:  Pre-visit preparation completed: Yes  Pain : No/denies pain     BMI - recorded: 32.77 Nutritional Status: BMI > 30  Obese Nutritional Risks: None Diabetes: No  Lab Results  Component Value Date   HGBA1C 5.8 04/01/2024   HGBA1C 5.6 10/02/2023   HGBA1C 5.5 03/21/2022     How often do you need to have someone help you when you read  instructions, pamphlets, or other written materials from your doctor or pharmacy?: 1 - Never  Interpreter Needed?: No  Information entered by :: Lolita Libra, CMA(AAMA)   Activities of Daily Living     05/28/2024   11:09 AM  In your present state of health, do you have any difficulty performing the following activities:  Hearing? 0  Vision? 0  Difficulty concentrating or making decisions? 0  Walking or climbing stairs? 0  Dressing or bathing? 0  Doing errands, shopping? 0  Preparing Food and eating ? N  Using the Toilet? N  In the past six months, have you accidently leaked urine? N  Do you have problems with  loss of bowel control? N  Managing your Medications? N  Managing your Finances? N  Housekeeping or managing your Housekeeping? N    Patient Care Team: Copland, Harlene BROCKS, MD as PCP - General (Family Medicine) Patrcia Sharper, MD as Consulting Physician (Ophthalmology)  I have updated your Care Teams any recent Medical Services you may have received from other providers in the past year.     Assessment:   This is a routine wellness examination for [facility].  Hearing/Vision screen Hearing Screening - Comments:: Denies hearing difficulties.  Vision Screening - Comments:: Up to date with routine eye exams with Dr Patrcia   Goals Addressed             This Visit's Progress    Patient Stated   On track    Maintain current health       Depression Screen     05/28/2024   11:12 AM 04/01/2024    9:07 AM 05/25/2023    3:51 PM 03/27/2023    8:45 AM 05/17/2022    9:42 AM 03/21/2022    8:58 AM 05/12/2021    9:47 AM  PHQ 2/9 Scores  PHQ - 2 Score 0 0 0 0 0 0 1  PHQ- 9 Score 0 0         Fall Risk     05/28/2024   11:14 AM 04/01/2024    9:07 AM 05/25/2023    3:47 PM 03/27/2023    8:45 AM 09/21/2022    8:56 AM  Fall Risk   Falls in the past year? 0 0 0 0 0  Number falls in past yr: 0 0 0 0 0  Injury with Fall? 0 0 0 0 0  Risk for fall due to : No Fall  Risks No Fall Risks No Fall Risks No Fall Risks   Follow up Education provided Falls evaluation completed Falls evaluation completed Falls evaluation completed Falls evaluation completed    MEDICARE RISK AT HOME:  Medicare Risk at Home Any stairs in or around the home?: No If so, are there any without handrails?: No Home free of loose throw rugs in walkways, pet beds, electrical cords, etc?: Yes Adequate lighting in your home to reduce risk of falls?: Yes Life alert?: No Use of a cane, walker or w/c?: No Grab bars in the bathroom?: No Shower chair or bench in shower?: Yes Elevated toilet seat or a handicapped toilet?: No  TIMED UP AND GO:  Was the test performed?  No,audio  Cognitive Function: 6CIT completed        05/28/2024   11:15 AM 05/25/2023    3:53 PM 05/17/2022    9:46 AM  6CIT Screen  What Year? 0 points 0 points 0 points  What month? 0 points 0 points 0 points  What time? 0 points 0 points 0 points  Count back from 20 0 points 0 points 0 points  Months in reverse 0 points 0 points 0 points  Repeat phrase 0 points 0 points 0 points  Total Score 0 points 0 points 0 points    Immunizations Immunization History  Administered Date(s) Administered   Fluad Quad(high Dose 65+) 06/22/2020, 07/27/2021   INFLUENZA, HIGH DOSE SEASONAL PF 07/08/2017, 06/08/2018, 04/10/2022, 04/17/2023, 05/17/2024   Influenza Split 06/08/2016   Influenza,inj,Quad PF,6+ Mos 06/24/2015   Influenza-Unspecified 03/31/2023   PFIZER Comirnaty(Gray Top)Covid-19 Tri-Sucrose Vaccine 12/21/2020   PFIZER(Purple Top)SARS-COV-2 Vaccination 09/29/2019, 10/23/2019, 05/29/2020   Pfizer Covid-19 Vaccine Bivalent Booster 53yrs & up 07/27/2021  Pfizer(Comirnaty)Fall Seasonal Vaccine 12 years and older 06/06/2022, 04/17/2023, 05/17/2024   Pneumococcal Conjugate-13 11/10/2014   Pneumococcal Polysaccharide-23 09/18/2017   Respiratory Syncytial Virus Vaccine ,Recomb Aduvanted(Arexvy ) 10/02/2023   Td  11/10/2014   Unspecified SARS-COV-2 Vaccination 03/31/2023   Zoster Recombinant(Shingrix ) 12/21/2020, 02/26/2021    Screening Tests Health Maintenance  Topic Date Due   Medicare Annual Wellness (AWV)  05/24/2024   DTaP/Tdap/Td (2 - Tdap) 11/09/2024   COVID-19 Vaccine (9 - Pfizer risk 2025-26 season) 11/15/2024   Pneumococcal Vaccine: 50+ Years  Completed   Influenza Vaccine  Completed   DEXA SCAN  Completed   Zoster Vaccines- Shingrix   Completed   Meningococcal B Vaccine  Aged Out   Hepatitis C Screening  Discontinued    Health Maintenance Items Addressed: All HM up to date.  Additional Screening:  Vision Screening: Recommended annual ophthalmology exams for early detection of glaucoma and other disorders of the eye. Is the patient up to date with their annual eye exam?  Yes  Who is the provider or what is the name of the office in which the patient attends annual eye exams? [facility]  Dental Screening: Recommended annual dental exams for proper oral hygiene  Community Resource Referral / Chronic Care Management: CRR required this visit?  No   CCM required this visit?  No   Plan:    I have personally reviewed and noted the following in the patient's chart:   Medical and social history Use of alcohol, tobacco or illicit drugs  Current medications and supplements including opioid prescriptions. Patient is not currently taking opioid prescriptions. Functional ability and status Nutritional status Physical activity Advanced directives List of other physicians Hospitalizations, surgeries, and ER visits in previous 12 months Vitals Screenings to include cognitive, depression, and falls Referrals and appointments  In addition, I have reviewed and discussed with patient certain preventive protocols, quality metrics, and best practice recommendations. A written personalized care plan for preventive services as well as general preventive health recommendations were  provided to patient.   Lolita Libra, CMA   05/28/2024   After Visit Summary: (MyChart) Due to this being a telephonic visit, the after visit summary with patients personalized plan was offered to patient via MyChart   Notes: Nothing significant to report at this time.

## 2024-05-28 NOTE — Patient Instructions (Signed)
 Ms. Peplinski , Thank you for taking time out of your busy schedule to complete your Annual Wellness Visit with me. I enjoyed our conversation and look forward to speaking with you again next year. I, as well as your care team,  appreciate your ongoing commitment to your health goals. Please review the following plan we discussed and let me know if I can assist you in the future. Your Game plan/ To Do List    Follow up Visits: Next Medicare AWV with our clinical staff:  05/30/25 11am, telephone.   Next Office Visit with your provider: 10/02/24 11am, Dr Watt.  Clinician Recommendations:  Aim for 30 minutes of exercise or brisk walking, 6-8 glasses of water, and 5 servings of fruits and vegetables each day.       This is a list of the screening recommended for you and due dates:  Health Maintenance  Topic Date Due   Medicare Annual Wellness Visit  05/24/2024   DTaP/Tdap/Td vaccine (2 - Tdap) 11/09/2024   COVID-19 Vaccine (9 - Pfizer risk 2025-26 season) 11/15/2024   Pneumococcal Vaccine for age over 70  Completed   Flu Shot  Completed   DEXA scan (bone density measurement)  Completed   Zoster (Shingles) Vaccine  Completed   Meningitis B Vaccine  Aged Out   Hepatitis C Screening  Discontinued    Advanced directives: (ACP Link)Information on Advanced Care Planning can be found at [place]  Secretary of [facility] Advance Health Care Directives Advance Health Care Directives. [email or web address]  Advance Care Planning is important because it:  [x]  Makes sure you receive the medical care that is consistent with your values, goals, and preferences  [x]  It provides guidance to your family and loved ones and reduces their decisional burden about whether or not they are making the right decisions based on your wishes.  Follow the link provided in your after visit summary or read over the paperwork we have mailed to you to help you started getting your Advance Directives in place. If you need assistance in  completing these, please reach out to us  so that we can help you!  See attachments for Preventive Care and Fall Prevention Tips.

## 2024-05-31 ENCOUNTER — Other Ambulatory Visit

## 2024-05-31 ENCOUNTER — Encounter: Payer: Self-pay | Admitting: Family Medicine

## 2024-05-31 DIAGNOSIS — D472 Monoclonal gammopathy: Secondary | ICD-10-CM | POA: Diagnosis not present

## 2024-05-31 LAB — CBC WITH DIFFERENTIAL/PLATELET
Basophils Absolute: 0 K/uL (ref 0.0–0.1)
Basophils Relative: 0.3 % (ref 0.0–3.0)
Eosinophils Absolute: 0.1 K/uL (ref 0.0–0.7)
Eosinophils Relative: 1.3 % (ref 0.0–5.0)
HCT: 38 % (ref 36.0–46.0)
Hemoglobin: 12.1 g/dL (ref 12.0–15.0)
Lymphocytes Relative: 34.4 % (ref 12.0–46.0)
Lymphs Abs: 1.4 K/uL (ref 0.7–4.0)
MCHC: 31.9 g/dL (ref 30.0–36.0)
MCV: 92.3 fl (ref 78.0–100.0)
Monocytes Absolute: 0.2 K/uL (ref 0.1–1.0)
Monocytes Relative: 6 % (ref 3.0–12.0)
Neutro Abs: 2.4 K/uL (ref 1.4–7.7)
Neutrophils Relative %: 58 % (ref 43.0–77.0)
Platelets: 172 K/uL (ref 150.0–400.0)
RBC: 4.12 Mil/uL (ref 3.87–5.11)
RDW: 13.6 % (ref 11.5–15.5)
WBC: 4.1 K/uL (ref 4.0–10.5)

## 2024-07-16 ENCOUNTER — Other Ambulatory Visit: Payer: Self-pay | Admitting: Family Medicine

## 2024-07-16 DIAGNOSIS — Z1231 Encounter for screening mammogram for malignant neoplasm of breast: Secondary | ICD-10-CM

## 2024-08-13 ENCOUNTER — Ambulatory Visit
Admission: RE | Admit: 2024-08-13 | Discharge: 2024-08-13 | Disposition: A | Source: Ambulatory Visit | Attending: Family Medicine | Admitting: Family Medicine

## 2024-08-13 DIAGNOSIS — Z1231 Encounter for screening mammogram for malignant neoplasm of breast: Secondary | ICD-10-CM

## 2024-10-02 ENCOUNTER — Ambulatory Visit: Admitting: Family Medicine

## 2025-05-30 ENCOUNTER — Ambulatory Visit
